# Patient Record
Sex: Female | Born: 1980 | Race: Black or African American | Hispanic: No | Marital: Single | Smoking: Never smoker
Health system: Southern US, Community
[De-identification: ages and names within clinical notes are randomized; demographics above are authoritative.]

## PROBLEM LIST (undated history)

## (undated) DIAGNOSIS — B354 Tinea corporis: Secondary | ICD-10-CM

## (undated) DIAGNOSIS — A749 Chlamydial infection, unspecified: Secondary | ICD-10-CM

## (undated) DIAGNOSIS — B9689 Other specified bacterial agents as the cause of diseases classified elsewhere: Secondary | ICD-10-CM

## (undated) DIAGNOSIS — N760 Acute vaginitis: Secondary | ICD-10-CM

## (undated) DIAGNOSIS — K0889 Other specified disorders of teeth and supporting structures: Secondary | ICD-10-CM

## (undated) HISTORY — PX: TUBAL LIGATION: SHX77

---

## 1999-09-08 ENCOUNTER — Emergency Department (HOSPITAL_COMMUNITY): Admission: EM | Admit: 1999-09-08 | Discharge: 1999-09-08 | Payer: Self-pay | Admitting: Emergency Medicine

## 2000-03-17 ENCOUNTER — Inpatient Hospital Stay (HOSPITAL_COMMUNITY): Admission: AD | Admit: 2000-03-17 | Discharge: 2000-03-17 | Payer: Self-pay | Admitting: Obstetrics

## 2000-05-16 ENCOUNTER — Inpatient Hospital Stay (HOSPITAL_COMMUNITY): Admission: AD | Admit: 2000-05-16 | Discharge: 2000-05-19 | Payer: Self-pay | Admitting: Obstetrics

## 2000-11-09 ENCOUNTER — Inpatient Hospital Stay (HOSPITAL_COMMUNITY): Admission: AD | Admit: 2000-11-09 | Discharge: 2000-11-09 | Payer: Self-pay | Admitting: Obstetrics & Gynecology

## 2001-04-09 ENCOUNTER — Inpatient Hospital Stay (HOSPITAL_COMMUNITY): Admission: AD | Admit: 2001-04-09 | Discharge: 2001-04-09 | Payer: Self-pay | Admitting: Obstetrics & Gynecology

## 2002-01-16 ENCOUNTER — Inpatient Hospital Stay (HOSPITAL_COMMUNITY): Admission: AD | Admit: 2002-01-16 | Discharge: 2002-01-16 | Payer: Self-pay | Admitting: Obstetrics

## 2002-01-17 ENCOUNTER — Encounter: Payer: Self-pay | Admitting: *Deleted

## 2002-01-17 ENCOUNTER — Ambulatory Visit (HOSPITAL_COMMUNITY): Admission: RE | Admit: 2002-01-17 | Discharge: 2002-01-17 | Payer: Self-pay | Admitting: *Deleted

## 2002-01-22 ENCOUNTER — Encounter (HOSPITAL_COMMUNITY): Admission: RE | Admit: 2002-01-22 | Discharge: 2002-02-21 | Payer: Self-pay | Admitting: *Deleted

## 2002-02-09 ENCOUNTER — Inpatient Hospital Stay (HOSPITAL_COMMUNITY): Admission: AD | Admit: 2002-02-09 | Discharge: 2002-02-09 | Payer: Self-pay | Admitting: *Deleted

## 2002-02-18 ENCOUNTER — Observation Stay (HOSPITAL_COMMUNITY): Admission: AD | Admit: 2002-02-18 | Discharge: 2002-02-18 | Payer: Self-pay | Admitting: *Deleted

## 2002-02-27 ENCOUNTER — Inpatient Hospital Stay (HOSPITAL_COMMUNITY): Admission: AD | Admit: 2002-02-27 | Discharge: 2002-02-27 | Payer: Self-pay | Admitting: Obstetrics and Gynecology

## 2002-03-01 ENCOUNTER — Encounter (HOSPITAL_COMMUNITY): Admission: RE | Admit: 2002-03-01 | Discharge: 2002-03-13 | Payer: Self-pay | Admitting: *Deleted

## 2002-03-13 ENCOUNTER — Inpatient Hospital Stay (HOSPITAL_COMMUNITY): Admission: AD | Admit: 2002-03-13 | Discharge: 2002-03-13 | Payer: Self-pay | Admitting: Obstetrics and Gynecology

## 2002-03-14 ENCOUNTER — Inpatient Hospital Stay (HOSPITAL_COMMUNITY): Admission: AD | Admit: 2002-03-14 | Discharge: 2002-03-16 | Payer: Self-pay | Admitting: *Deleted

## 2002-10-28 ENCOUNTER — Ambulatory Visit (HOSPITAL_COMMUNITY): Admission: RE | Admit: 2002-10-28 | Discharge: 2002-10-28 | Payer: Self-pay | Admitting: *Deleted

## 2003-01-21 ENCOUNTER — Inpatient Hospital Stay (HOSPITAL_COMMUNITY): Admission: AD | Admit: 2003-01-21 | Discharge: 2003-01-21 | Payer: Self-pay | Admitting: *Deleted

## 2003-02-11 ENCOUNTER — Inpatient Hospital Stay (HOSPITAL_COMMUNITY): Admission: AD | Admit: 2003-02-11 | Discharge: 2003-02-11 | Payer: Self-pay | Admitting: *Deleted

## 2003-02-14 ENCOUNTER — Inpatient Hospital Stay (HOSPITAL_COMMUNITY): Admission: AD | Admit: 2003-02-14 | Discharge: 2003-02-14 | Payer: Self-pay | Admitting: Family Medicine

## 2003-02-16 ENCOUNTER — Inpatient Hospital Stay (HOSPITAL_COMMUNITY): Admission: AD | Admit: 2003-02-16 | Discharge: 2003-02-19 | Payer: Self-pay | Admitting: Obstetrics and Gynecology

## 2003-08-04 ENCOUNTER — Emergency Department (HOSPITAL_COMMUNITY): Admission: EM | Admit: 2003-08-04 | Discharge: 2003-08-04 | Payer: Self-pay | Admitting: Emergency Medicine

## 2004-04-27 ENCOUNTER — Other Ambulatory Visit: Admission: RE | Admit: 2004-04-27 | Discharge: 2004-04-27 | Payer: Self-pay | Admitting: Obstetrics and Gynecology

## 2004-06-16 ENCOUNTER — Inpatient Hospital Stay (HOSPITAL_COMMUNITY): Admission: AD | Admit: 2004-06-16 | Discharge: 2004-06-19 | Payer: Self-pay | Admitting: Obstetrics and Gynecology

## 2005-02-17 ENCOUNTER — Inpatient Hospital Stay (HOSPITAL_COMMUNITY): Admission: AD | Admit: 2005-02-17 | Discharge: 2005-02-17 | Payer: Self-pay | Admitting: Obstetrics & Gynecology

## 2005-07-15 ENCOUNTER — Other Ambulatory Visit: Admission: RE | Admit: 2005-07-15 | Discharge: 2005-07-15 | Payer: Self-pay | Admitting: Obstetrics and Gynecology

## 2005-08-22 ENCOUNTER — Inpatient Hospital Stay (HOSPITAL_COMMUNITY): Admission: AD | Admit: 2005-08-22 | Discharge: 2005-08-24 | Payer: Self-pay | Admitting: Obstetrics and Gynecology

## 2005-08-23 ENCOUNTER — Encounter (INDEPENDENT_AMBULATORY_CARE_PROVIDER_SITE_OTHER): Payer: Self-pay | Admitting: *Deleted

## 2009-01-13 ENCOUNTER — Emergency Department (HOSPITAL_COMMUNITY): Admission: EM | Admit: 2009-01-13 | Discharge: 2009-01-13 | Payer: Self-pay | Admitting: Emergency Medicine

## 2009-05-19 ENCOUNTER — Emergency Department (HOSPITAL_COMMUNITY): Admission: EM | Admit: 2009-05-19 | Discharge: 2009-05-19 | Payer: Self-pay | Admitting: Emergency Medicine

## 2010-02-09 ENCOUNTER — Emergency Department (HOSPITAL_COMMUNITY): Admission: EM | Admit: 2010-02-09 | Discharge: 2010-02-09 | Payer: Self-pay | Admitting: Family Medicine

## 2010-08-20 ENCOUNTER — Emergency Department (HOSPITAL_COMMUNITY)
Admission: EM | Admit: 2010-08-20 | Discharge: 2010-08-20 | Payer: Self-pay | Source: Home / Self Care | Admitting: Family Medicine

## 2010-11-01 LAB — POCT URINALYSIS DIP (DEVICE)
Glucose, UA: NEGATIVE mg/dL
Hgb urine dipstick: NEGATIVE
Nitrite: NEGATIVE
Protein, ur: 30 mg/dL — AB
Specific Gravity, Urine: 1.025 (ref 1.005–1.030)
Urobilinogen, UA: 1 mg/dL (ref 0.0–1.0)
pH: 6 (ref 5.0–8.0)

## 2010-11-01 LAB — GC/CHLAMYDIA PROBE AMP, GENITAL: Chlamydia, DNA Probe: POSITIVE — AB

## 2010-11-01 LAB — WET PREP, GENITAL

## 2010-11-01 LAB — POCT PREGNANCY, URINE: Preg Test, Ur: NEGATIVE

## 2010-11-18 LAB — DIFFERENTIAL
Basophils Absolute: 0 10*3/uL (ref 0.0–0.1)
Basophils Relative: 0 % (ref 0–1)
Lymphocytes Relative: 28 % (ref 12–46)
Monocytes Absolute: 0.7 10*3/uL (ref 0.1–1.0)
Neutro Abs: 3.9 10*3/uL (ref 1.7–7.7)
Neutrophils Relative %: 60 % (ref 43–77)

## 2010-11-18 LAB — URINALYSIS, ROUTINE W REFLEX MICROSCOPIC
Leukocytes, UA: NEGATIVE
Nitrite: NEGATIVE
Protein, ur: 100 mg/dL — AB
Urobilinogen, UA: 1 mg/dL (ref 0.0–1.0)

## 2010-11-18 LAB — URINE MICROSCOPIC-ADD ON

## 2010-11-18 LAB — COMPREHENSIVE METABOLIC PANEL
Albumin: 4.2 g/dL (ref 3.5–5.2)
Alkaline Phosphatase: 45 U/L (ref 39–117)
BUN: 15 mg/dL (ref 6–23)
Chloride: 106 mEq/L (ref 96–112)
Creatinine, Ser: 1.27 mg/dL — ABNORMAL HIGH (ref 0.4–1.2)
Glucose, Bld: 87 mg/dL (ref 70–99)
Potassium: 3 mEq/L — ABNORMAL LOW (ref 3.5–5.1)
Total Bilirubin: 0.8 mg/dL (ref 0.3–1.2)
Total Protein: 7.7 g/dL (ref 6.0–8.3)

## 2010-11-18 LAB — CBC
HCT: 31.5 % — ABNORMAL LOW (ref 36.0–46.0)
Hemoglobin: 10.4 g/dL — ABNORMAL LOW (ref 12.0–15.0)
MCV: 86.7 fL (ref 78.0–100.0)
Platelets: 250 10*3/uL (ref 150–400)
RDW: 13.6 % (ref 11.5–15.5)

## 2010-11-18 LAB — PREGNANCY, URINE: Preg Test, Ur: NEGATIVE

## 2010-11-18 LAB — LIPASE, BLOOD: Lipase: 38 U/L (ref 11–59)

## 2010-12-31 NOTE — Discharge Summary (Signed)
[name], [name]            ACCOUNT NO.:  [id number]   MEDICAL RECORD NO.:  [id number]          PATIENT TYPE:  INP   LOCATION:  9111                          F[facility]ILITY:  [facility]   PHYSICIAN:  Crist Fat. Rivard, M.D. DATE OF BIRTH:  August 13, 1981   DATE OF ADMISSION:  08/22/2005  DATE OF DISCHARGE:  08/24/2005                                 DISCHARGE SUMMARY   ADMITTING DIAGNOSES:  1.  Intrauterine pregnancy at 40-3/7 weeks.  2.  Early labor.  3.  Positive group B Strep.  4.  Desires tubal sterilization.   DISCHARGE DIAGNOSES:  1.  Intrauterine pregnancy at term.  2.  Desired tubal sterilization.  3.  Postpartum anemia without hemodynamic compromise.   PROCEDURES:  1.  Spontaneous vaginal birth.  2.  Postpartum tubal ligation.  3.  Epidural anesthesia.  4.  General anesthesia.   HOSPITAL COURSE:  [name] is a 30 year old gravida 5, para 4-0-0-4 at  40-3/7 weeks who was admitted on August 22, 2005 with early labor.  Her  pregnancy had been remarkable for grand multiparity, history of postpartum  depression with her first pregnancy requiring no medications, closely spaced  pregnancy, history of abnormal Pap, anemia, desires tubal sterilization,  positive group B Strep.  On admission cervix was 4, 75%, vertex at a -2  station.  She was contracting every five to eight minutes.  She was admitted  to birthing suite, given antibiotic prophylaxis for group B Strep.  She was  started on Pitocin for labor augmentation.  Artificial ruptured membranes  was accomplished with clear fluid noted.  She progressed well to delivery at  5:47 p.m. on the afternoon of August 22, 2005.  She had an intact perineum.  She had epidural anesthesia.  She desired tubal sterilization.  Consents had  been signed during her pregnancy.  Infant was taken to the full-term  nursery.  By postpartum day one patient was doing well.  Patient was bottle  feeding.  She was out of bed without difficulty.  No  dizziness or syncope.  Her hemoglobin was 7.7 down from 10.  White blood cell count 8.6.  Platelets  were 204.  She was taken to the operating room where a postpartum tubal  sterilization was performed.  Initially epidural anesthesia was administered  through the pre-existing catheter.  However, this was inadequate and the  patient was put to sleep with general endotracheal anesthesia.  She  tolerated the procedure well and was taken to recovery in good condition.  By postpartum day two, postoperative day one patient was doing well.  She  was up ad lib.  Her orthostatics were stable.  She was deemed to have  received the full benefit of her hospital stay and was discharged home.  Her  incision was clean, dry, and intact and her vital signs were stable.   CONDITION ON DISCHARGE:  Stable.   DISCHARGE MEDICATIONS:  1.  Motrin 600 mg p.o. q.6h. p.r.n. pain.  2.  Tylox one to two p.o. q.3-4h. p.r.n. pain.  3.  Hemocyte one p.o. daily.   DISCHARGE INSTRUCTIONS:  Per [place]  OB handout.   DISCHARGE FOLLOW-UP:  Six weeks at [facility].      Renaldo Reel Emilee Hero, C.N.M.      Crist Fat Rivard, M.D.  Electronically Signed    VLL/MEDQ  D:  08/24/2005  T:  08/24/2005  Job:  [id number]

## 2010-12-31 NOTE — H&P (Signed)
NAMESHAKARA, [name]            ACCOUNT NO.:  [id number]   MEDICAL RECORD NO.:  [id number]          PATIENT TYPE:  INP   LOCATION:  9175                          [facility]  [facility]   PHYSICIAN:  Hal Morales, M.D.DATE OF BIRTH:  09-30-1980   DATE OF ADMISSION:  08/22/2005  DATE OF DISCHARGE:                                HISTORY & PHYSICAL   [name] is a 30 year old gravida 5, para 4-0-0-4 at 40-3/7 weeks who  presents today with uterine contractions every five to eight minutes for  several hours.  Her pregnancy has been remarkable for grand multiparity,  history of postpartum depression with her first pregnancy (no medications),  closely spaced pregnancy, history of abnormal Pap, anemia, desires tubal  sterilization, positive group B Strep.   PRENATAL LABORATORIES:  Blood type is B+.  Rh antibody negative.  VDRL  nonreactive.  Rubella titer positive.  Hepatitis B surface antigen negative.  HIV and cystic fibrosis testing were declined.  Sickle cell test negative.  GC and Chlamydia cultures were negative in the second trimester.  Pap was  normal.  Group B Strep culture was positive at 36 weeks.  GC and Chlamydia  cultures were negative at 36 weeks.  Hemoglobin upon entry into practice was  11.  It was 9.4 at 32 weeks.   HISTORY OF PRESENT PREGNANCY:  Patient entered care at approximately 19-6/7  weeks.  She declined quadruple screen.  She had an ultrasound soon after  that showing normal growth and development and a female infant.  Patient had  several missed appointments, was not seen again from 20 weeks to  approximately 32 weeks.  Glucola was given which was normal.  She had  another ultrasound at 32 weeks secondary to slight size greater than dates.  Growth was at the 50-57th percentile.  Everything was normal.  She had  another missed appointment.  35 weeks she had GBS and GC/Chlamydia cultures  done.  She had another ultrasound following that with normal findings.  Positive beta Strep was noted.   PAST OBSTETRICAL HISTORY:  In 2001 she had a vaginal birth of a female infant,  weight 7 pounds 4 ounces at 40 weeks.  She was in labor nine hours.  She had  epidural anesthesia.  That child was delivered by Dr. Gaynell Face.  2004 she  had a vaginal birth of a female infant, weight 8 pounds at 40 weeks.  She was  in labor four hours with epidural anesthesia.  She was cared for by teaching  service physicians during that pregnancy and she had positive beta Strep.  She did have postpartum depression following her first pregnancy, but was  not on any medication.  In 2005 she had a vaginal birth of a female infant  weight 6 pounds 13 ounces at 40 weeks.  She was in labor six hours.  She had  epidural anesthesia with that pregnancy and was delivered by the teaching  service.  In November of 2005 she had a vaginal birth of a female infant,  weight 5 pounds 10 ounces at 37 weeks.  She was in labor five  hours.  She  had epidural anesthesia.  She was delivered with [facility] during  that pregnancy.   PAST MEDICAL HISTORY:  She had a colposcopy with a biopsy in 1998.  She was  treated for gonorrhea in 1999.  She has no known medication allergies.   FAMILY HISTORY:  Maternal grandmother had hypertension.  Maternal  grandmother and paternal grandmother had diabetes.   GENETIC HISTORY:  Unremarkable.   SOCIAL HISTORY:  Patient is single.  Father of the baby is not currently  involved and not currently present with her.  She does have a friend with  her today.  She is African-American.  She denies any alcohol, drug, or  tobacco use during this pregnancy and has been followed by the certified  nurse midwife service at [facility].   PHYSICAL EXAMINATION:  VITAL SIGNS:  Stable.  Patient is afebrile.  HEENT:  Within normal limits.  LUNGS:  Bilateral breath sounds are clear.  HEART:  Regular rate and rhythm without murmur.  BREASTS:  Soft and nontender.   ABDOMEN:  Fundal height is approximately 38 cm.  Estimated fetal weight is  7.5 pounds.  Uterine contractions are every four to eight minutes, mild to  moderate quality.  PELVIC:  Cervical examination is 4 cm, 75%, vertex at a -2 station.  The  vertex is applied, but is quite high.  Fetal heart rate is reactive with no  decelerations.  EXTREMITIES:  Deep tendon reflexes are 2+ without clonus.  There is a trace  edema noted.   IMPRESSION:  1.  Intrauterine pregnancy at 40-3/7 weeks.  2.  Early labor.  3.  Closely spaced pregnancy.  4.  Positive group B Strep.  5.  Desires tubal sterilization.   PLAN:  1.  Admit to birthing suite for consult with Dr. Dierdre Forth as      attending physician.  2.  Routine certified nurse midwife orders.  3.  Patient currently is holding in maternity admissions.  When she is able      to be transferred to birthing suite will reevaluate her cervix for      artificial rupture of membranes or possible augmentation.  4.  Group B Strep prophylaxis with ampicillin secondary to the patient's      history of rapid labor.  5.  Will investigate tubal ligation request and see if papers have been      signed yet.      Renaldo Reel Emilee Hero, C.N.M.      Hal Morales, M.D.  Electronically Signed    VLL/MEDQ  D:  08/22/2005  T:  08/22/2005  Job:  [id number]

## 2010-12-31 NOTE — Op Note (Signed)
[name], [name]            ACCOUNT NO.:  [id number]   MEDICAL RECORD NO.:  [id number]          PATIENT TYPE:  INP   LOCATION:  9111                          [facility]   PHYSICIAN:  Naima A. Dillard, M.D. DATE OF BIRTH:  Jan 02, 1981   DATE OF PROCEDURE:  08/23/2005  DATE OF DISCHARGE:                                 OPERATIVE REPORT   PREOPERATIVE DIAGNOSIS:  Multiparity, desires postpartum tubal ligation.   POSTOPERATIVE DIAGNOSIS:  Multiparity, desires postpartum tubal ligation.   PROCEDURE:  Postpartum tubal ligation.   SURGEON:  Naima A. Normand Sloop, M.D.   ANESTHESIA:  General endotracheal tube due to the epidural was not found to  be adequate.   ESTIMATED BLOOD LOSS:  Minimal.   COMPLICATIONS:  None.   FINDINGS:  Normal fallopian tubes bilaterally.   PROCEDURE IN DETAIL:  Before the procedure, the patient was noted to be  anemic with a hemoglobin of 7.7.  Before delivery, her hemoglobin was 10.  She was advised that we could wait six weeks for hemoglobin to arise and do  a postpartum tubal or presume the postpartum tubal now, but if there was any  bleeding she would probably need some blood transfusion, and the risks and  benefits of blood transfusion was reviewed with the patient.  The patient  has still decided to proceed with the procedure.  She was taken to the  operating room and her epidural anesthesia was not found to be adequate, so  she was given general anesthesia with an endotracheal tube.  She was prepped  and draped in a normal sterile fashion.  Marcaine 0.5% with epinephrine 5 mL  was used for local anesthesia.  A 10 mm infraumbilical incision was made  with a scalpel and carried down to the fascia.  The fascia was incised in  the midline and carried down bilaterally.  The fascia was then excised and  then extended bilaterally.  The peritoneum was identified, tented up and  entered sharply without difficulty.  The patient's right fallopian tube  was  identified, followed out to its fimbriated end, suture ligated with 2-0  plain and about a centimeter of the midisthmic portion of the tube was  excised, hemostasis was assured, and the tube was returned back to the  abdomen.  The patient's left fallopian tube was identified, followed out to  the fimbriated end.  About a centimeter of the tube was ligated with 2-0  plain and excised.  Hemostasis was assured.  The patient also had a small  tubal cyst, which was excised, and the  mesosalpinx was repaired with 2-0 plain with a free tie.  Hemostasis was  assured.  The tube was returned to the abdomen.  The fascia was closed with  0 Vicryl.  The skin was closed with 3-0 Monocryl and Dermabond.  Sponge, lap  and needle counts were correct x2.  The patient returned to the recovery  room in stable condition.      Naima A. Normand Sloop, M.D.  Electronically Signed     NAD/MEDQ  D:  08/23/2005  T:  08/23/2005  Job:  [id number]

## 2010-12-31 NOTE — H&P (Signed)
[name], [name]            ACCOUNT NO.:  [id number]   MEDICAL RECORD NO.:  [id number]          PATIENT TYPE:  INP   LOCATION:  9167                          [facility]   PHYSICIAN:  Cam Hai, C.N.M.  DATE OF BIRTH:  Jul 08, 1981   DATE OF ADMISSION:  06/16/2004  DATE OF DISCHARGE:                                HISTORY & PHYSICAL   HISTORY OF PRESENT ILLNESS:  The patient is a 30 year old single black  female, gravida 4, para 3-0-0-3, at 37-4/7 weeks who presents with regular  uterine contractions since 7 p.m. and had irregular more mild contractions  earlier today.  She denies leaking, bleeding, headache, nausea and vomiting,  and visual disturbances.  Her pregnancy has been followed by the [facility] certified nurse midwife service and has been remarkable for:  1)  Closely spaced pregnancies.  2) History of abnormal Pap smear.  3) Anemia.  4) Group B Strep negative.  Her cervix was 1 to 2 cm at 3 p.m. today.   PRENATAL LABORATORY DATA:  Collected on March 11, 2004; hemoglobin 9.9,  hematocrit 29.4, platelets 266,000. Blood type B positive, antibody  negative, sickle cell trait negative, RPR nonreactive, rubella immune,  hepatitis B surface antigen negative, HIV nonreactive, Gonorrhea negative,  Chlamydia negative.  Her one-hour Glucola from April 27, 2004, was 65  and her RPR at that time was nonreactive.  Pap smear from April 27, 2004, was within normal limits.  Culture of the vaginal tract for Group B  Strep, gonorrhea and Chlamydia on June 08, 2004, were all negative.   HISTORY OF PRESENT PREGNANCY:  The patient presented for care at [facility] on January 19, 2004, at 16-2/[redacted] weeks gestation.  Pregnancy  ultrasonography from January 28, 2004, shows growth consistent with previous  dating confirming [facility] of July 03, 2004.  She was treated for Candida  twice during the pregnancy.  She had an ultrasound for growth due to  measuring size  greater than dates.  This was done on May 25, 2004, at [redacted]  weeks gestation and showed estimated fetal weight of the 50th to 75th  percentile with normal fluid and with a breech presentation at that time.  Follow-up ultrasound at 36-1/[redacted] weeks gestation showed a vertex presentation.  The patient expressed the desire for bilateral tubal ligation during the  pregnancy and she did sign her papers.   PAST OBSTETRICAL HISTORY:  She is a gravida 4, para 3-0-0-3.  In 2001, she  had a vaginal delivery of a female infant weighing 7 pounds 4 ounces at [redacted]  weeks gestation after nine hours in labor.  She had an epidural for  anesthesia.  Infant's name was [place].  In 2003, she had a vaginal  delivery of a female infant weighing 6 pounds 13 ounces at [redacted] weeks  gestation after six hours of labor.  She had an epidural for anesthesia and  the infant's name was [place].  In 2004, she had a vaginal delivery of a female  infant weighing 8 pounds.  This was at [redacted] weeks gestation after four hours  in labor with epidural for anesthesia and the infant's name is [place].  She  reports being anemic with her pregnancies.  Also reports postpartum  depression following her first pregnancy lasting one month with no  treatment.   ALLERGIES:  No known drug allergies.   PAST MEDICAL HISTORY:  She experienced menarche at the age of 57 with  regular cycles since, lasting four to five days.  She has had a colposcopy  with biopsy in 1998.  She was treated for gonorrhea in 1999.  She reports  having Group B Strep with her third pregnancy.  She reports having had the  usual childhood diseases.   PAST SURGICAL HISTORY:  Negative.   FAMILY HISTORY:  Remarkable for maternal grandmother with hypertension and  both grandmothers with diabetes.  Genetic history is negative.   SOCIAL HISTORY:  The father of the baby's name is Windy Fast.  He is involved  with the pregnancy.  The patient is GED educated and employed at [employer].  She  denies any alcohol, tobacco, or illicit drug use with the pregnancy.   PHYSICAL EXAMINATION:  VITAL SIGNS:  Stable, she is afebrile.  HEENT:  Grossly within normal limits.  CHEST:  Clear to auscultation.  HEART:  Regular rate and rhythm.  ABDOMEN:  Gravid in contour.  The fundal height extends approximately 38 cm  above the pubic symphysis.  Fetal heart rate is reactive and reassuring.  Uterine contractions every four to five minutes.  PELVIC:  Cervix is 3 cm, 100% effaced, vertex -2 with bulging bag of water  and prior to ambulating was 2 cm and 60%.  EXTREMITIES:  Within normal limits.   ASSESSMENT:  1.  Intrauterine pregnancy at term.  2.  Early labor.   PLAN:  Admit to birthing suite for consult with Dr. Stefano Gaul.  Routine  C.N.M. orders.  The patient plans epidural for labor.  Anticipate normal  spontaneous vaginal delivery.      KS/MEDQ  D:  06/16/2004  T:  06/16/2004  Job:  [id number]

## 2011-09-16 ENCOUNTER — Encounter (HOSPITAL_COMMUNITY): Payer: Self-pay

## 2011-09-16 ENCOUNTER — Emergency Department (INDEPENDENT_AMBULATORY_CARE_PROVIDER_SITE_OTHER)
Admission: EM | Admit: 2011-09-16 | Discharge: 2011-09-16 | Disposition: A | Discharge status: Home / Self Care | Payer: Self-pay | Source: Home / Self Care | Attending: Emergency Medicine | Admitting: Emergency Medicine

## 2011-09-16 DIAGNOSIS — N760 Acute vaginitis: Secondary | ICD-10-CM

## 2011-09-16 DIAGNOSIS — B354 Tinea corporis: Secondary | ICD-10-CM

## 2011-09-16 LAB — WET PREP, GENITAL
Trich, Wet Prep: NONE SEEN
Yeast Wet Prep HPF POC: NONE SEEN

## 2011-09-16 LAB — POCT URINALYSIS DIP (DEVICE)
Hgb urine dipstick: NEGATIVE
Protein, ur: NEGATIVE mg/dL
Specific Gravity, Urine: 1.025 (ref 1.005–1.030)
Urobilinogen, UA: 0.2 mg/dL (ref 0.0–1.0)
pH: 5.5 (ref 5.0–8.0)

## 2011-09-16 LAB — POCT PREGNANCY, URINE: Preg Test, Ur: NEGATIVE

## 2011-09-16 MED ORDER — KETOCONAZOLE 2 % EX CREA: TOPICAL_CREAM | Freq: Every day | CUTANEOUS | Status: DC

## 2011-09-16 MED ORDER — METRONIDAZOLE 500 MG PO TABS: 500.0000 mg | ORAL_TABLET | Freq: Two times a day (BID) | ORAL | Status: AC

## 2011-09-16 MED ORDER — FLUCONAZOLE 150 MG PO TABS
ORAL_TABLET | ORAL | Status: DC
Start: 2011-09-16 — End: 2011-11-16

## 2011-09-16 NOTE — ED Provider Notes (Signed)
History     CSN: [id number]  Arrival date & time 09/16/11  4098   First MD Initiated Contact with Patient 09/16/11 [phone]      Chief Complaint  Patient presents with  . Vaginal Discharge  . Rash    (Consider location/radiation/quality/duration/timing/severity/associated sxs/prior treatment) HPI Comments: Last Saturday had some discomfort and then the discharge started, the pain its gone, but the discharge its worse, its white, also with  itchy rash on my R breast   Patient is a 31 y.o. female presenting with vaginal discharge and rash. The history is provided by the patient.  Vaginal Discharge This is a new problem. The current episode started more than 1 week ago. The problem occurs constantly. The problem has not changed since onset.Associated symptoms include abdominal pain. The symptoms are aggravated by nothing. The symptoms are relieved by nothing. She has tried nothing for the symptoms.  Rash     History reviewed. No pertinent past medical history.  History reviewed. No pertinent past surgical history.  No family history on file.  History  Substance Use Topics  . Smoking status: Never Smoker   . Smokeless tobacco: Not on file  . Alcohol Use: Yes    OB History    Grav Para Term Preterm Abortions TAB SAB Ect Mult Living                  Review of Systems  Constitutional: Negative for fever, appetite change and fatigue.  Eyes: Negative for photophobia, itching and visual disturbance.  Gastrointestinal: Positive for abdominal pain.  Genitourinary: Positive for vaginal discharge. Negative for urgency and genital sores.  Musculoskeletal: Negative for arthralgias.  Skin: Positive for rash.    Allergies  Review of patient's allergies indicates no known allergies.  Home Medications   Current Outpatient Rx  Name Route Sig Dispense Refill  . FLUCONAZOLE 150 MG PO TABS  1 tab po today repeat in 7 days 2 tablet 0  . KETOCONAZOLE 2 % EX CREA Topical Apply topically  daily. Apply twice a day x 3 weeks 60 g 0  . METRONIDAZOLE 500 MG PO TABS Oral Take 1 tablet (500 mg total) by mouth 2 (two) times daily. 14 tablet 0    BP 121/87  Pulse 76  Temp(Src) 98.5 F (36.9 C) (Oral)  Resp 16  SpO2 100%  LMP 08/29/2011  Physical Exam  Nursing note and vitals reviewed. Constitutional: She appears well-developed and well-nourished. No distress.  Abdominal: She exhibits no mass. There is no tenderness. There is no rebound and no guarding.  Genitourinary: Vaginal discharge found.  Neurological: She is alert.  Skin: Rash noted.       ED Course  Procedures (including critical care time)  Labs Reviewed  POCT URINALYSIS DIP (DEVICE) - Abnormal; Notable for the following:    Ketones, ur TRACE (*)    Leukocytes, UA TRACE (*) Biochemical Testing Only. Please order routine urinalysis from main lab if confirmatory testing is needed.   All other components within normal limits  POCT PREGNANCY, URINE  GC/CHLAMYDIA PROBE AMP, GENITAL  WET PREP, GENITAL   No results found.   1. Vaginitis   2. Tinea corporis       MDM  Vaginal discharge, and co-existent R sided rash with tinea corporis characteristics        Jimmie Molly, MD 09/16/11 [phone]

## 2011-09-16 NOTE — ED Notes (Signed)
Wet prep: Many clue cells, many WBC's.  Pt. adequately treated with Flagyl.  GC/Chlamydia pending. Vassie Moselle 09/16/2011

## 2011-09-16 NOTE — ED Notes (Signed)
C/o vaginal discharge with odor since Saturday.  States sx started with sharp abdominal pain which resolved.  Pt states at the same time she noticed a rash to rt breast on Saturday-rash was itchy at first but now has scabbed over and itching resolved.  Denies urinary sx.

## 2011-09-19 NOTE — ED Notes (Signed)
GC/Chlamydia neg. Vassie Moselle 09/19/2011

## 2011-11-16 ENCOUNTER — Emergency Department (INDEPENDENT_AMBULATORY_CARE_PROVIDER_SITE_OTHER)
Admission: EM | Admit: 2011-11-16 | Discharge: 2011-11-16 | Disposition: A | Discharge status: Home / Self Care | Payer: Self-pay | Source: Home / Self Care | Attending: Emergency Medicine | Admitting: Emergency Medicine

## 2011-11-16 ENCOUNTER — Encounter (HOSPITAL_COMMUNITY): Payer: Self-pay | Admitting: *Deleted

## 2011-11-16 DIAGNOSIS — R109 Unspecified abdominal pain: Secondary | ICD-10-CM

## 2011-11-16 HISTORY — DX: Chlamydial infection, unspecified: A74.9

## 2011-11-16 HISTORY — DX: Other specified bacterial agents as the cause of diseases classified elsewhere: N76.0

## 2011-11-16 HISTORY — DX: Tinea corporis: B35.4

## 2011-11-16 HISTORY — DX: Other specified bacterial agents as the cause of diseases classified elsewhere: B96.89

## 2011-11-16 LAB — POCT URINALYSIS DIP (DEVICE)
Nitrite: NEGATIVE
Protein, ur: 30 mg/dL — AB
Specific Gravity, Urine: >=1.03 (ref 1.005–1.030)
Urobilinogen, UA: 0.2 mg/dL (ref 0.0–1.0)

## 2011-11-16 LAB — POCT H PYLORI SCREEN: H. PYLORI SCREEN, POC: NEGATIVE

## 2011-11-16 LAB — POCT PREGNANCY, URINE: Preg Test, Ur: NEGATIVE

## 2011-11-16 MED ORDER — PANTOPRAZOLE SODIUM 40 MG PO TBEC: 40.0000 mg | DELAYED_RELEASE_TABLET | Freq: Every day | ORAL | Status: DC

## 2011-11-16 MED ORDER — FAMOTIDINE 20 MG PO TABS: 20.0000 mg | ORAL_TABLET | Freq: Every day | ORAL | Status: DC

## 2011-11-16 NOTE — Discharge Instructions (Signed)
Take the medication as written. Followup with a gastroenterologist if her symptoms do not get better within 2 weeks. Return if you've a fever above 100.4, if you're unable to keep any liquids down, if you get worse, or for any other concerns.

## 2011-11-16 NOTE — ED Notes (Signed)
Pt states she has been having intermittent, cramping abd pain since Saturday.  States when the pain occurs it causes her to break out in a sweat and feels like she has to have a BM.  Occasionally will have diarrhea, other times can't do anything.  States that sometimes it's with eating, sometimes just smelling foods.  Other times she can eat and has no pain.  This am she vomited one time and had diarrhea.  Has not had anything to eat today.  States she feel hungry.  Menstrual period also started Saturday, doesn't usually have any pain with her periods.  Denies pain at present time.

## 2011-11-16 NOTE — ED Provider Notes (Signed)
History     CSN: [id number]  Arrival date & time 11/16/11  1011   First MD Initiated Contact with Patient 11/16/11 1109      Chief Complaint  Patient presents with  . Abdominal Pain  . Diarrhea  . Emesis    (Consider location/radiation/quality/duration/timing/severity/associated sxs/prior treatment) HPI Comments: Patient reports intermittent episodes of diffuse, poorly characterized abdominal pain that occurs primarily after eating. States symptoms start about an hour or so after eating. Contacts last approximate 10 minutes and then resolve. Patient sometimes feels the urge to defecate, and has had 2 loose, nonbloody stools. This morning reports loose stool, followed by an episode of emesis. Pain is not associated with fasting, movement, stooling, urination. No other aggravating factors. Patient states she feels better when she lays down, but no other alleviating factors. Patient hasn't tried anything for this. No recent change in diet, recent travel, recent antibiotics. No abdominal distention, back pain, urinary complaints, vaginal discharge. Patient is currently on menses. STDs not a concern today. Patient has no history of PUD, gallstones, IBS.  ROS as noted in HPI. All other ROS negative.   Patient is a 31 y.o. female presenting with abdominal pain. The history is provided by the patient. No language interpreter was used.  Abdominal Pain The primary symptoms of the illness include abdominal pain. The current episode started more than 2 days ago. The onset of the illness was sudden. The problem has not changed since onset. The patient states that she believes she is currently not pregnant. The patient has had a change in bowel habit. Symptoms associated with the illness do not include chills, anorexia, diaphoresis, heartburn, constipation, urgency, hematuria, frequency or back pain.    History reviewed. No pertinent past medical history.  Past Surgical History  Procedure Date  .  Tubal ligation     No family history on file.  History  Substance Use Topics  . Smoking status: Never Smoker   . Smokeless tobacco: Not on file  . Alcohol Use: Yes     socially    OB History    Grav Para Term Preterm Abortions TAB SAB Ect Mult Living                  Review of Systems  Constitutional: Negative for chills and diaphoresis.  Gastrointestinal: Positive for abdominal pain. Negative for heartburn, constipation and anorexia.  Genitourinary: Negative for urgency, frequency and hematuria.  Musculoskeletal: Negative for back pain.    Allergies  Review of patient's allergies indicates no known allergies.  Home Medications   Current Outpatient Rx  Name Route Sig Dispense Refill  . FLUCONAZOLE 150 MG PO TABS  1 tab po today repeat in 7 days 2 tablet 0  . KETOCONAZOLE 2 % EX CREA Topical Apply topically daily. Apply twice a day x 3 weeks 60 g 0    BP 128/67  Pulse 67  Temp(Src) 99 F (37.2 C) (Oral)  Resp 17  SpO2 100%  LMP 11/12/2011  Physical Exam  Nursing note and vitals reviewed. Constitutional: She is oriented to person, place, and time. She appears well-developed and well-nourished.  HENT:  Head: Normocephalic and atraumatic.  Eyes: Conjunctivae and EOM are normal.  Neck: Normal range of motion.  Cardiovascular: Normal rate and regular rhythm.   Murmur heard. Pulmonary/Chest: Effort normal and breath sounds normal.  Abdominal: Soft. Normal appearance and bowel sounds are normal. She exhibits no distension. There is no hepatosplenomegaly. There is no tenderness. There is no rigidity,  no rebound, no guarding and no CVA tenderness. No hernia.  Musculoskeletal: Normal range of motion.  Neurological: She is alert and oriented to person, place, and time.  Skin: Skin is warm and dry.  Psychiatric: She has a normal mood and affect. Her behavior is normal. Judgment and thought content normal.    ED Course  Procedures (including critical care  time)  Labs Reviewed - No data to display No results found.   No diagnosis found. Results for orders placed during the hospital encounter of 11/16/11  POCT H PYLORI SCREEN      Component Value Range   H. PYLORI SCREEN, POC NEGATIVE  NEGATIVE   POCT URINALYSIS DIP (DEVICE)      Component Value Range   Glucose, UA NEGATIVE  NEGATIVE (mg/dL)   Bilirubin Urine NEGATIVE  NEGATIVE    Ketones, ur NEGATIVE  NEGATIVE (mg/dL)   Specific Gravity, Urine >=1.030  1.005 - 1.030    Hgb urine dipstick LARGE (*) NEGATIVE    pH 6.0  5.0 - 8.0    Protein, ur 30 (*) NEGATIVE (mg/dL)   Urobilinogen, UA 0.2  0.0 - 1.0 (mg/dL)   Nitrite NEGATIVE  NEGATIVE    Leukocytes, UA NEGATIVE  NEGATIVE   POCT PREGNANCY, URINE      Component Value Range   Preg Test, Ur NEGATIVE  NEGATIVE       MDM  Previous records, labs reviewed. Additional medical history obtained Patient was seen a month ago, thought to have vaginitis and tinea corporis. Had BV. Also history of Chlamydia.   Pt abd exam is benign, no peritoneal signs. No evidence of surgical abd. Doubt SBO, mesenteric ischemia, appendicitis, hepatitis, cholecystitis, pancreatitis, or perforated viscus. No evidence to support or suggest GYN pathology such as ovarian torsion or infection. Will check H. pylori screen, and start H2 blocker if no improvement with this, will have her followup with GI.   Reviewed labs. Udip noted. Patient on menses. Discussed labs and plan with patient, who agrees.  Luiz Blare, MD 11/16/11 [phone]

## 2012-11-01 ENCOUNTER — Encounter (HOSPITAL_COMMUNITY): Payer: Self-pay | Admitting: Emergency Medicine

## 2012-11-01 ENCOUNTER — Emergency Department (HOSPITAL_COMMUNITY)
Admission: EM | Admit: 2012-11-01 | Discharge: 2012-11-02 | Disposition: A | Discharge status: Home / Self Care | Payer: Medicaid Other | Attending: Emergency Medicine | Admitting: Emergency Medicine

## 2012-11-01 DIAGNOSIS — R197 Diarrhea, unspecified: Secondary | ICD-10-CM

## 2012-11-01 DIAGNOSIS — Z9851 Tubal ligation status: Secondary | ICD-10-CM | POA: Insufficient documentation

## 2012-11-01 DIAGNOSIS — R112 Nausea with vomiting, unspecified: Secondary | ICD-10-CM | POA: Insufficient documentation

## 2012-11-01 DIAGNOSIS — Z8619 Personal history of other infectious and parasitic diseases: Secondary | ICD-10-CM | POA: Insufficient documentation

## 2012-11-01 DIAGNOSIS — Z8742 Personal history of other diseases of the female genital tract: Secondary | ICD-10-CM | POA: Insufficient documentation

## 2012-11-01 DIAGNOSIS — R1084 Generalized abdominal pain: Secondary | ICD-10-CM | POA: Insufficient documentation

## 2012-11-01 LAB — BASIC METABOLIC PANEL
CO2: 21 mEq/L (ref 19–32)
Chloride: 102 mEq/L (ref 96–112)
GFR calc Af Amer: 83 mL/min — ABNORMAL LOW (ref >90)
Potassium: 3.4 mEq/L — ABNORMAL LOW (ref 3.5–5.1)
Sodium: 136 mEq/L (ref 135–145)

## 2012-11-01 MED ORDER — SODIUM CHLORIDE 0.9 % IV BOLUS (SEPSIS)
1000.0000 mL | Freq: Once | INTRAVENOUS | Status: AC
  Administered 2012-11-01: 1000 mL via INTRAVENOUS

## 2012-11-01 MED ORDER — SODIUM CHLORIDE 0.9 % IV SOLN: 1000.0000 mL | Freq: Once | INTRAVENOUS | Status: DC

## 2012-11-01 MED ORDER — SODIUM CHLORIDE 0.9 % IV SOLN: 1000.0000 mL | INTRAVENOUS | Status: DC

## 2012-11-01 MED ORDER — ONDANSETRON 4 MG PO TBDP: 4.0000 mg | ORAL_TABLET | Freq: Four times a day (QID) | ORAL | Status: DC | PRN

## 2012-11-01 MED ORDER — ONDANSETRON HCL 4 MG/2ML IJ SOLN
4.0000 mg | Freq: Once | INTRAMUSCULAR | Status: AC
  Administered 2012-11-01: 4 mg via INTRAVENOUS
  Filled 2012-11-01: qty 2

## 2012-11-01 MED ORDER — KETOROLAC TROMETHAMINE 30 MG/ML IJ SOLN
15.0000 mg | Freq: Once | INTRAMUSCULAR | Status: AC
  Administered 2012-11-01: 15 mg via INTRAVENOUS
  Filled 2012-11-01: qty 1

## 2012-11-01 NOTE — ED Notes (Signed)
Patient with body aches chills, nausea, vomiting and diarrhea.  Symptoms started around 1pm today.  No respiratory symptoms.

## 2012-11-01 NOTE — ED Notes (Signed)
Pt presents with c/o nausea, vomiting, and diarrhea that started earlier today   Pt is c/o generalized abd pain   Pt is actively vomiting in triage

## 2012-11-05 NOTE — ED Provider Notes (Signed)
History    32 year old female with nausea, vomiting diarrhea. Symptom onset earlier today. Mild crampy, diffuse abdominal pain. Does localize. No urinary complaints. No fevers or chills. No blood in her stool or emesis. No recent antibiotic use. No significant travel history. No recent backpack or camping. No sick contacts. No significant past medical history. No intervention prior to arrival.  CSN: [id number]  Arrival date & time 11/01/12  2110   First MD Initiated Contact with Patient 11/01/12 2256      Chief Complaint  Patient presents with  . Emesis  . Diarrhea    (Consider location/radiation/quality/duration/timing/severity/associated sxs/prior treatment) HPI  Past Medical History  Diagnosis Date  . BV (bacterial vaginosis)   . Tinea corporis   . Chlamydia     Past Surgical History  Procedure Laterality Date  . Tubal ligation      Family History  Problem Relation Age of Onset  . Stroke Other   . Diabetes Other   . Hypertension Other     History  Substance Use Topics  . Smoking status: Never Smoker   . Smokeless tobacco: Not on file  . Alcohol Use: Yes     Comment: socially    OB History   Grav Para Term Preterm Abortions TAB SAB Ect Mult Living                  Review of Systems  All systems reviewed and negative, other than as noted in HPI.   Allergies  Review of patient's allergies indicates no known allergies.  Home Medications   Current Outpatient Rx  Name  Route  Sig  Dispense  Refill  . ondansetron (ZOFRAN ODT) 4 MG disintegrating tablet   Oral   Take 1 tablet (4 mg total) by mouth every 6 (six) hours as needed for nausea.   15 tablet   0     BP 107/61  Pulse 88  Temp(Src) 98.2 F (36.8 C) (Oral)  Resp 20  SpO2 100%  LMP 10/29/2012  Physical Exam  Nursing note and vitals reviewed. Constitutional: She appears well-developed and well-nourished. No distress.  HENT:  Head: Normocephalic and atraumatic.  Eyes: Conjunctivae are  normal. Right eye exhibits no discharge. Left eye exhibits no discharge.  Neck: Neck supple.  Cardiovascular: Normal rate, regular rhythm and normal heart sounds.  Exam reveals no gallop and no friction rub.   No murmur heard. Pulmonary/Chest: Effort normal and breath sounds normal. No respiratory distress.  Abdominal: Soft. She exhibits no distension. There is no tenderness.  Musculoskeletal: She exhibits no edema and no tenderness.  Neurological: She is alert.  Skin: Skin is warm and dry.  Psychiatric: She has a normal mood and affect. Her behavior is normal. Thought content normal.    ED Course  Procedures (including critical care time)  Labs Reviewed  BASIC METABOLIC PANEL - Abnormal; Notable for the following:    Potassium 3.4 (*)    Glucose, Bld 129 (*)    GFR calc non Af Amer 72 (*)    GFR calc Af Amer 83 (*)    All other components within normal limits   No results found.    1. Nausea vomiting and diarrhea       MDM  31yF with n/v/d for less than 24 hours. Benign abdominal exam. Well appearing. Labs fairly unremarkable. I feel safe for DC at this time. Plan continued symptomatic tx.         Raeford Razor, MD 11/06/12 [phone]

## 2014-07-04 ENCOUNTER — Encounter (HOSPITAL_COMMUNITY): Payer: Self-pay | Admitting: Emergency Medicine

## 2014-07-04 ENCOUNTER — Emergency Department (HOSPITAL_COMMUNITY)
Admission: EM | Admit: 2014-07-04 | Discharge: 2014-07-04 | Disposition: A | Discharge status: Home / Self Care | Payer: Medicaid Other | Attending: Emergency Medicine | Admitting: Emergency Medicine

## 2014-07-04 DIAGNOSIS — Z791 Long term (current) use of non-steroidal anti-inflammatories (NSAID): Secondary | ICD-10-CM | POA: Insufficient documentation

## 2014-07-04 DIAGNOSIS — Z8619 Personal history of other infectious and parasitic diseases: Secondary | ICD-10-CM | POA: Insufficient documentation

## 2014-07-04 DIAGNOSIS — K088 Other specified disorders of teeth and supporting structures: Secondary | ICD-10-CM | POA: Insufficient documentation

## 2014-07-04 DIAGNOSIS — K0889 Other specified disorders of teeth and supporting structures: Secondary | ICD-10-CM

## 2014-07-04 DIAGNOSIS — K029 Dental caries, unspecified: Secondary | ICD-10-CM

## 2014-07-04 MED ORDER — HYDROCODONE-ACETAMINOPHEN 5-325 MG PO TABS: 1.0000 | ORAL_TABLET | Freq: Four times a day (QID) | ORAL | Status: DC | PRN

## 2014-07-04 MED ORDER — HYDROCODONE-ACETAMINOPHEN 5-325 MG PO TABS
1.0000 | ORAL_TABLET | Freq: Once | ORAL | Status: AC
  Administered 2014-07-04: 1 via ORAL
  Filled 2014-07-04: qty 1

## 2014-07-04 MED ORDER — PENICILLIN V POTASSIUM 500 MG PO TABS: 500.0000 mg | ORAL_TABLET | Freq: Three times a day (TID) | ORAL | Status: AC

## 2014-07-04 NOTE — ED Provider Notes (Signed)
CSN: [id number]     Arrival date & time 07/04/14  0904 History  This chart was scribed for non-physician practitioner, Terri Piedraourtney Forcucci, PA-C,working with Gwyneth SproutWhitney Plunkett, MD, by Karle PlumberJennifer Tensley, ED Scribe. This patient was seen in room TR07C/TR07C and the patient's care was started at 9:42 AM.  Chief Complaint  Patient presents with  . Dental Pain   Patient is a 33 y.o. female presenting with tooth pain. The history is provided by the patient. No language interpreter was used.  Dental Pain   HPI Comments:  [name] is a 33 y.o. female who presents to the Emergency Department complaining of severe left lower dental pain that began two days ago. She reports having a fractured tooth. Pt reports associated swelling of the surrounding gingiva. She states she has taken Aleve, Advil and tramadol for the pain with minimal relief. Denies inability to swallow, SOB, fever, chills, nausea or vomiting. She does not have a Education officer, communitydentist.  Past Medical History  Diagnosis Date  . BV (bacterial vaginosis)   . Tinea corporis   . Chlamydia    Past Surgical History  Procedure Laterality Date  . Tubal ligation     Family History  Problem Relation Age of Onset  . Stroke Other   . Diabetes Other   . Hypertension Other    History  Substance Use Topics  . Smoking status: Never Smoker   . Smokeless tobacco: Not on file  . Alcohol Use: Yes     Comment: socially   OB History    No data available     Review of Systems  HENT: Positive for dental problem. Negative for trouble swallowing.   All other systems reviewed and are negative.   Allergies  Review of patient's allergies indicates no known allergies.  Home Medications   Prior to Admission medications   Medication Sig Start Date End Date Taking? Authorizing Provider  ibuprofen (ADVIL,MOTRIN) 200 MG tablet Take 200 mg by mouth every 6 (six) hours as needed (for pain).   Yes Historical Provider, MD  naproxen sodium (ALEVE) 220 MG  tablet Take 220 mg by mouth 2 (two) times daily with a meal.   Yes Historical Provider, MD  HYDROcodone-acetaminophen (NORCO/VICODIN) 5-325 MG per tablet Take 1 tablet by mouth every 6 (six) hours as needed for moderate pain or severe pain. 07/04/14   Aldean Suddeth A Forcucci, PA-C  ondansetron (ZOFRAN ODT) 4 MG disintegrating tablet Take 1 tablet (4 mg total) by mouth every 6 (six) hours as needed for nausea. Patient not taking: Reported on 07/04/2014 11/01/12   Raeford RazorStephen Kohut, MD  penicillin v potassium (VEETID) 500 MG tablet Take 1 tablet (500 mg total) by mouth 3 (three) times daily. 07/04/14 07/11/14  Edrick Whitehorn A Forcucci, PA-C   BP 132/80 mmHg  Pulse 87  Temp(Src) 99 F (37.2 C) (Oral)  Resp 18  Ht 5\' 6"  (1.676 m)  Wt 185 lb (83.915 kg)  BMI 29.87 kg/m2  SpO2 100%  LMP 06/27/2014 Physical Exam  Constitutional: She is oriented to person, place, and time. She appears well-developed and well-nourished.  HENT:  Head: Normocephalic and atraumatic.  Nose: Nose normal.  Mouth/Throat: Uvula is midline, oropharynx is clear and moist and mucous membranes are normal. No trismus in the jaw. Dental caries present.    Poor dentition throughout.  Eyes: EOM are normal. Pupils are equal, round, and reactive to light.  Neck: Normal range of motion.  Cardiovascular: Normal rate, regular rhythm and normal heart sounds.  Exam reveals  no gallop and no friction rub.   No murmur heard. Pulmonary/Chest: Effort normal and breath sounds normal. No respiratory distress. She has no wheezes. She has no rales.  Musculoskeletal: Normal range of motion.  Neurological: She is alert and oriented to person, place, and time.  Skin: Skin is warm and dry.  Psychiatric: She has a normal mood and affect. Her behavior is normal.  Nursing note and vitals reviewed.   ED Course  Procedures (including critical care time) DIAGNOSTIC STUDIES:   COORDINATION OF CARE: 9:48 AM- Will refer to oral surgeon and prescribe PCN and  pain medication. Pt verbalizes understanding and agrees to plan.  Medications  HYDROcodone-acetaminophen (NORCO/VICODIN) 5-325 MG per tablet 1 tablet (not administered)    Labs Review Labs Reviewed - No data to display  Imaging Review No results found.   EKG Interpretation None      MDM   Final diagnoses:  Pain, dental  Complex dental caries   Patient is a 33 year old female who presents to the emergency room for left lower dental pain. Vital signs are stable. Physical exam reveals no evidence of trismus, facial swelling, or airway compromise. There is no evidence of ludwigs angina at this time. We will discharge the patient home with a short course of hydrocodone 5/325 #10, and penicillin V. patient to return for worsening shortness of breath, neck swelling, trismus, or any other concerning symptoms. I have instructed her to follow up with a dentist of her choosing. Patient states understanding and agreement at this time. Patient is stable for discharge.   I personally performed the services described in this documentation, which was scribed in my presence. The recorded information has been reviewed and is accurate.    Eben Burowourtney A Forcucci, PA-C 07/04/14 [phone]  Gwyneth SproutWhitney Plunkett, MD 07/04/14 1505

## 2014-07-04 NOTE — ED Notes (Signed)
Patient states broken tooth L lower x 3 months ago.   Patient states started hurting Wednesday.  Patient states "I took aleve -2, tramadol - 2, ibuprofen 4, and advil 1yesterday".

## 2014-07-04 NOTE — Discharge Instructions (Signed)
Dental Extraction A dental extraction procedure refers to a routine tooth extraction performed by your dentist. The procedure depends on where and how the tooth is positioned. The procedure can be very quick, sometimes lasting only seconds. Reasons for dental extraction include:  Tooth decay.  Infections (abscesses).  The need to make room for other teeth.  Gum diseases where the supporting bone has been destroyed.  Fractures of the tooth leaving it unrestorable.  Extra teeth (supernumerary) or grossly malformed teeth.  Baby teeththat have not fallen out in time and have not permitted the the permanent teeth to erupt properly.  In preparation for braces where there is not enough room to align the teeth properly.  Not enough room for wisdom teeth (particularly those that are impacted).  Prior to receiving radiation to the head and neck,teeth in the field of radiation may need to be extracted. LET YOUR CAREGIVER KNOW ABOUT:  Any allergies.  All medicines you are taking:  Including herbs, eye drops, over-the-counter medications, and creams.  Blood thinners (anticoagulants), aspirin, or other drugs that may affect blood clotting.  Use of steroids (through mouth or as creams).  Previous problems with anesthetics, including local anesthetics.  History of bleeding or blood problems.  Previous surgery.  Possibility of pregnancy if this applies.  Smoking history.  Any health problems. RISKS AND COMPLICATIONS As with any procedure, complications may occur, but they can usually be managed by your caregiver. General surgical complications may include:  Reaction to anesthesia.  Damage to surrounding teeth, nerves, tissues, or structures.  Infection.  Bleeding. With appropriate treatment and care after surgery, the following complications are very uncommon:  Dry socket (blood clot does not form or stay in place over empty socket). This can delay healing.  Incomplete  extraction of roots.  Jawbone injury, pain, or weakness. BEFORE THE PROCEDURE  Your dental care provider will:  Take a medical and dental history.  Take an X-ray to evaluate the circumstances and how to best extract the tooth.  Do an oral exam.  Depending on the situation, antibiotics may be given before or after the extraction.  Your caregivers may review the procedure, the local anesthesia and/or sedation being used, and what to expect after the procedure with you.  If needed, your dentist may give you a form of sedation, either by medicine you swallow, gas, or intravenously (IV). This will help to relieve anxiety. Complicated extractions may require the use of general anesthesia. It is important to follow your caregiver's instructions prior to your procedure to avoid complications. Steps before your procedure may include:  Alert your caregiver if you feel ill (sore throat, fever, upset stomach, etc.) in the days leading up to your procedure.  Stop taking certain medications for several days prior to your procedure such as blood thinners.  Take certain medications, such as antibiotics.  Avoid eating and drinking for several hours before the procedure. This will help you to avoid complications from the sedation or anesthesia.  Sign a patient consent form.  Have a friend or family member drive you to the dentist and drive you home after the procedure.  Wear comfortable, loose clothing. Limit makeup and jewelry.  Quit smoking. If you are a smoker, this will raise the chances of a healing problem after your procedure. If you are thinking about quitting, talk to your surgeon about how long before the operation you should stop smoking. You may also get help from your primary caregiver. PROCEDURE Dental extraction is typically done  as an outpatient procedure. IV sedation, local anesthesia, or both may be used. It will keep you comfortable and free of pain during the procedure.    There are 2 types of extractions:  Simple extraction involves a tooth that is visible in the mouth and above the gum line. After local anesthetic is given by injection, and the area is numbed, the dentist will loosen the tooth with a special instrument (elevator). Then another instrument (forceps) will be used to grasp the tooth and remove it from its socket. During the procedure you will feel some pressure, but you should not feel pain. If you do feel pain, tell your dentist. The open socket will be cleaned. Dressings (gauze) will be placed in the socket to reduce bleeding.  Surgical extractions are used if the tooth has not come into the mouth or the tooth is broken off below the gum line. The dentist will make a cut (incision) in the gum and may have to remove some of the bone around the tooth to aid in the removal of the tooth. After removal, stitches (sutures) may be required to close the area to help in healing and control bleeding. For some surgical extractions, you may need a general anesthetic or IV sedation (through the vein). After both types of extractions, you may be given pain medication or other drugs to help healing. Other postoperative instructions will be given by your dental caregiver. AFTER THE PROCEDURE  You will have gauze in your mouth where the tooth was removed. Gentle pressure on the gauze for up to 1 hour will help to control bleeding.  A blood clot will begin to form over the open socket. This is normal. Do not touch the area or rinse it.  Your pain will be controlled with medication and self-care.  You will be given detailed instructions for care after surgery. PROGNOSIS While some discomfort is normal after tooth extraction, most patients recover fully in just a few days. SEEK IMMEDIATE DENTAL CARE  You have uncontrolled bleeding, marked swelling, or severe pain.  You develop a fever, difficulty swallowing, or other severe symptoms.  You have questions or  concerns. Document Released: 08/01/2005 Document Revised: 12/16/2013 Document Reviewed: 11/05/2010 [facility] Patient Information 2015 [place], [place]. This information is not intended to replace advice given to you by your health care provider. Make sure you discuss any questions you have with your health care provider.   Dental Caries Dental caries (also called tooth decay) is the most common oral disease. It can occur at any age but is more common in children and young adults.  HOW DENTAL CARIES DEVELOPS  The process of decay begins when bacteria and foods (particularly sugars and starches) combine in your mouth to produce plaque. Plaque is a substance that sticks to the hard, outer surface of a tooth (enamel). The bacteria in plaque produce acids that attack enamel. These acids may also attack the root surface of a tooth (cementum) if it is exposed. Repeated attacks dissolve these surfaces and create holes in the tooth (cavities). If left untreated, the acids destroy the other layers of the tooth.  RISK FACTORS  Frequent sipping of sugary beverages.   Frequent snacking on sugary and starchy foods, especially those that easily get stuck in the teeth.   Poor oral hygiene.   Dry mouth.   Substance abuse such as methamphetamine abuse.   Broken or poor-fitting dental restorations.   Eating disorders.   Gastroesophageal reflux disease (GERD).   Certain radiation  treatments to the head and neck. SYMPTOMS In the early stages of dental caries, symptoms are seldom present. Sometimes white, chalky areas may be seen on the enamel or other tooth layers. In later stages, symptoms may include:  Pits and holes on the enamel.  Toothache after sweet, hot, or cold foods or drinks are consumed.  Pain around the tooth.  Swelling around the tooth. DIAGNOSIS  Most of the time, dental caries is detected during a regular dental checkup. A diagnosis is made after a thorough medical and dental  history is taken and the surfaces of your teeth are checked for signs of dental caries. Sometimes special instruments, such as lasers, are used to check for dental caries. Dental X-ray exams may be taken so that areas not visible to the eye (such as between the contact areas of the teeth) can be checked for cavities.  TREATMENT  If dental caries is in its early stages, it may be reversed with a fluoride treatment or an application of a remineralizing agent at the dental office. Thorough brushing and flossing at home is needed to aid these treatments. If it is in its later stages, treatment depends on the location and extent of tooth destruction:   If a small area of the tooth has been destroyed, the destroyed area will be removed and cavities will be filled with a material such as gold, silver amalgam, or composite resin.   If a large area of the tooth has been destroyed, the destroyed area will be removed and a cap (crown) will be fitted over the remaining tooth structure.   If the center part of the tooth (pulp) is affected, a procedure called a root canal will be needed before a filling or crown can be placed.   If most of the tooth has been destroyed, the tooth may need to be pulled (extracted). HOME CARE INSTRUCTIONS You can prevent, stop, or reverse dental caries at home by practicing good oral hygiene. Good oral hygiene includes:  Thoroughly cleaning your teeth at least twice a day with a toothbrush and dental floss.   Using a fluoride toothpaste. A fluoride mouth rinse may also be used if recommended by your dentist or health care provider.   Restricting the amount of sugary and starchy foods and sugary liquids you consume.   Avoiding frequent snacking on these foods and sipping of these liquids.   Keeping regular visits with a dentist for checkups and cleanings. PREVENTION   Practice good oral hygiene.  Consider a dental sealant. A dental sealant is a coating material that  is applied by your dentist to the pits and grooves of teeth. The sealant prevents food from being trapped in them. It may protect the teeth for several years.  Ask about fluoride supplements if you live in a community without fluorinated water or with water that has a low fluoride content. Use fluoride supplements as directed by your dentist or health care provider.  Allow fluoride varnish applications to teeth if directed by your dentist or health care provider. Document Released: 04/23/2002 Document Revised: 12/16/2013 Document Reviewed: 08/03/2012 [facility] Patient Information 2015 [place], [place]. This information is not intended to replace advice given to you by your health care provider. Make sure you discuss any questions you have with your health care provider.   Dental Pain A tooth ache may be caused by cavities (tooth decay). Cavities expose the nerve of the tooth to air and hot or cold temperatures. It may come from an infection  or abscess (also called a boil or furuncle) around your tooth. It is also often caused by dental caries (tooth decay). This causes the pain you are having. DIAGNOSIS  Your caregiver can diagnose this problem by exam. TREATMENT   If caused by an infection, it may be treated with medications which kill germs (antibiotics) and pain medications as prescribed by your caregiver. Take medications as directed.  Only take over-the-counter or prescription medicines for pain, discomfort, or fever as directed by your caregiver.  Whether the tooth ache today is caused by infection or dental disease, you should see your dentist as soon as possible for further care. SEEK MEDICAL CARE IF: The exam and treatment you received today has been provided on an emergency basis only. This is not a substitute for complete medical or dental care. If your problem worsens or new problems (symptoms) appear, and you are unable to meet with your dentist, call or return to this location. SEEK  IMMEDIATE MEDICAL CARE IF:   You have a fever.  You develop redness and swelling of your face, jaw, or neck.  You are unable to open your mouth.  You have severe pain uncontrolled by pain medicine. MAKE SURE YOU:   Understand these instructions.  Will watch your condition.  Will get help right away if you are not doing well or get worse. Document Released: 08/01/2005 Document Revised: 10/24/2011 Document Reviewed: 03/19/2008 [facility] Patient Information 2015 [place], [place]. This information is not intended to replace advice given to you by your health care provider. Make sure you discuss any questions you have with your health care provider.   Emergency Department Resource Guide 1) Find a Doctor and Pay Out of Pocket Although you won't have to find out who is covered by your insurance plan, it is a good idea to ask around and get recommendations. You will then need to call the office and see if the doctor you have chosen will accept you as a new patient and what types of options they offer for patients who are self-pay. Some doctors offer discounts or will set up payment plans for their patients who do not have insurance, but you will need to ask so you aren't surprised when you get to your appointment.  2) Contact Your Local Health Department Not all health departments have doctors that can see patients for sick visits, but many do, so it is worth a call to see if yours does. If you don't know where your local health department is, you can check in your phone book. The CDC also has a tool to help you locate your state's health department, and many state websites also have listings of all of their local health departments.  3) Find a Walk-in Clinic If your illness is not likely to be very severe or complicated, you may want to try a walk in clinic. These are popping up all over the country in pharmacies, drugstores, and shopping centers. They're usually staffed by nurse practitioners or  physician assistants that have been trained to treat common illnesses and complaints. They're usually fairly quick and inexpensive. However, if you have serious medical issues or chronic medical problems, these are probably not your best option.  No Primary Care Doctor: - Call Health Connect at  [phone] - they can help you locate a primary care doctor that  accepts your insurance, provides certain services, etc. - Physician Referral Service- [phone]  Chronic Pain Problems: Organization         Address  Phone   Notes  [facility]  [phone] Patients need to be referred by their primary care doctor.   Medication Assistance: Organization         Address  Phone   Notes  [facility] Medication [facility] [address] [place] [place], [place] [address] [phone] --Must be a resident of [facility] -- Must have NO insurance coverage whatsoever (no Medicaid/ Medicare, etc.) -- The pt. MUST have a primary care doctor that directs their care regularly and follows them in the community   MedAssist  [phone]   [employer]  [phone]    Agencies that provide inexpensive medical care: Organization         Address  Phone   Notes  [facility]  [phone]   [facility]    [phone]   [facility] [address] [place], [place] [address] [phone]   [facility] [address], [place] [phone]   Planned Parenthood    [phone]   [facility]    [phone]   [facility]  [address], [place] Phone:  [phone], Fax:  [phone] Hours of Operation:  9 am - 6 pm, M-F.  Also accepts Medicaid/Medicare and self-pay.  [facility]  [address], [place] Phone: [phone], Fax: [phone]. Hours of Operation:  8:30 am - 5:30 pm, M-F.   Also accepts Medicaid and self-pay.  [facility] [place] [address] [place] Phone: [phone]   [facility] [address] Natasha Bence [place], [place] [phone], Ext. [phone] Mondays & Thursdays: 7-9 AM.  First 15 patients are seen on a first come, first serve basis.    Medicaid-accepting [facility] Providers:  Organization         Address  Phone   Notes  [facility] [address], [place] [phone] Also accepts self-pay patients.  [facility] [address] Laurell Josephs [place], [place]  [phone]   [facility] [address], [place] [phone]   [facility] [address], [place] [phone]   Renaye Rakers [address], [place]   [phone] Only accepts [place] Access [place] patients after they have their name applied to their card.   Self-Pay (no insurance) in [facility]:  Organization         Address  Phone   Notes  Sickle Cell Patients, [facility] [address] [place], [place] [phone]   [facility] [address] [place], [place] [phone]   [facility] [place]  [address], [place] [phone]   [facility]/Dr. Osei-Bonsu  [address] [place] or [address], [place] [phone] Phone number for both [place] and [place] locations is the same.  [facility] [address], [place] [phone]   [facility] [address] [place] or [address] [phone]   [facility] [address] [place] [phone], phone; [phone], fax Sees patients 1st and 3rd Saturday of every month.  Must not qualify for public or  private insurance (i.e. Medicaid, Medicare, [place] Health Choice, Veterans'  Benefits)  Household income should be no more than 200% of the poverty level The clinic cannot treat you if you are pregnant or think you are pregnant  Sexually transmitted diseases are not treated at the clinic.    Dental Care: Organization         Address  Phone  Notes  [facility] Department of [facility] [address] [place], [place] [phone] Accepts children up to age 13 who are enrolled in [place] or [place] Health Choice; pregnant women with a Medicaid card; and children who have applied for Medicaid or [place] Health Choice, but were declined, whose parents can pay a reduced fee at time of service.  [facility] Department of [facility]  [address], [place] [phone] Accepts children up to age 39 who are enrolled in [place] or [place] Health Choice; pregnant women with a Medicaid card; and children who have applied for Medicaid or [place] Health Choice, but were declined, whose parents can pay a reduced fee at time of service.  [facility]  [address] [place], [place] [phone] Patients are seen by appointment only. Walk-ins are not accepted. [facility] will see patients 51 years of age and older. Monday - Tuesday (8am-5pm) Most Wednesdays (8:30-5pm) $30 per visit, cash only  [facility]  [address], [facility] [phone] Patients are seen by appointment only. Walk-ins are not accepted. [facility] will see patients 64 years of age and older. One Wednesday Evening (Monthly: Volunteer Based).  $30 per visit, cash only  [employer]  [phone] for adults; Children under age 39, call Graduate Pediatric Dentistry at [phone]. Children aged 32-14, please call [phone] to request a pediatric application.  Dental services are provided in all areas of dental care including fillings, crowns and bridges, complete and partial  dentures, implants, gum treatment, root canals, and extractions. Preventive care is also provided. Treatment is provided to both adults and children. Patients are selected via a lottery and there is often a waiting list.   [facility] [address] [place]  [phone] [email or web address]   [facility] [address] [place], [place] [phone], Ext. [phone] Second and Fourth Thursday of each month, opens at 6:30 AM; Clinic ends at 9 AM.  Patients are seen on a first-come first-served basis, and a limited number are seen during each clinic.   [facility]  [address] Ether Griffins [place], [place] [phone]   Eligibility Requirements You must have lived in [place], [place], or [place] counties for at least the last three months.   You cannot be eligible for state or federal sponsored [place], including [employer], [place], or [employer].   You generally cannot be eligible for healthcare insurance through your employer.    How to apply: Eligibility screenings are held every Tuesday and Wednesday afternoon from 1:00 pm until 4:00 pm. You do not need an appointment for the interview!  [facility] [address], [place], [place] [phone]   [facility] Health Department  [phone]   [facility] Health Department  [phone]   [facility] Health Department  [phone]    Behavioral Health Resources in the Community: Intensive Outpatient Programs Organization         Address  Phone  Notes  [place] [address] [place], [place] [phone]   [facility] Outpatient [address], [place], [place]   ADS: [facility] Drug S[facility] [address] [place], [place]   [place] [address]  [place], [place] or [phone]   Substance Abuse Resources Organization          Address  Phone  Notes  [facility] Drug Services  [phone]   [place]  [phone]   [facility] [place]   Chinita Pester  [phone]   Residential & Outpatient Substance Abuse Program  [phone]   Psychological Services Organization         Address  Phone  Notes  [facility] [place]  [place]  [phone]   [place] [address], [place] or [phone]    Mobile Crisis Teams Organization         Address  Phone  Notes  [facility], Mobile Crisis Care Unit  [phone]   [facility]  [address] [place], [place]   [place] Levels [address], [place] [phone]    Self-Help/Support Groups Organization         Address  Phone             Notes  [place]. of [place] - variety of support groups  [place] Call for more information  Narcotics Anonymous (NA), [facility] [address], [employer] [place]  2 meetings at this location   Special educational needs teacher         Address  Phone  Notes  [facility] [place],    [place]  [phone]   [facility]  [address] [place] [id number], [place], [place]   [place], [place] [phone] Admissions: 8am-3pm M-F  Incentives Substance [place] [address]    [place], [place] [phone]   [facility] [address] [place], [place], [place]   [facility] [address]  [place], [place]   [facility] - Intensive Outpatient [address] Kristeen Mans [address], [place], [place]   [facility] ([place]  [place], [place] [phone] or [phone]   [facility] ([facility]) [address] [place], [place] Accepts Medicaid  [facility] [place] [address],  [place] [phone] Substance Abuse/Addiction Treatment   [facility] Organization         Address  Phone  Notes  [facility]  [phone]   Domenic Schwab, PhD [address] Arlis Porta [place], [place]   [phone] or [phone]   [facility] [place], [place] [phone]   [facility] [address] [place], [place] [phone] Insurance/Medicaid/sponsorship through [facility] and Families [address]                                    [place], [place] [phone] [place] [address], [place] [phone]    Dr. Adele Schilder  [phone]   [facility]  Health Dept. 1) [address] [place] 2) [place] 3)  [place] [address], [place] [phone]  [phone]   [place] [phone] or [phone] (After Hours)

## 2014-11-11 ENCOUNTER — Emergency Department (HOSPITAL_COMMUNITY)
Admission: EM | Admit: 2014-11-11 | Discharge: 2014-11-11 | Disposition: A | Discharge status: Home / Self Care | Payer: Medicaid Other | Attending: Emergency Medicine | Admitting: Emergency Medicine

## 2014-11-11 ENCOUNTER — Encounter (HOSPITAL_COMMUNITY): Payer: Self-pay | Admitting: Emergency Medicine

## 2014-11-11 DIAGNOSIS — Z791 Long term (current) use of non-steroidal anti-inflammatories (NSAID): Secondary | ICD-10-CM | POA: Insufficient documentation

## 2014-11-11 DIAGNOSIS — Z8742 Personal history of other diseases of the female genital tract: Secondary | ICD-10-CM | POA: Diagnosis not present

## 2014-11-11 DIAGNOSIS — K0889 Other specified disorders of teeth and supporting structures: Secondary | ICD-10-CM

## 2014-11-11 DIAGNOSIS — Z8619 Personal history of other infectious and parasitic diseases: Secondary | ICD-10-CM | POA: Diagnosis not present

## 2014-11-11 DIAGNOSIS — K089 Disorder of teeth and supporting structures, unspecified: Secondary | ICD-10-CM | POA: Diagnosis present

## 2014-11-11 DIAGNOSIS — K047 Periapical abscess without sinus: Secondary | ICD-10-CM | POA: Insufficient documentation

## 2014-11-11 MED ORDER — AMOXICILLIN 500 MG PO CAPS: 500.0000 mg | ORAL_CAPSULE | Freq: Three times a day (TID) | ORAL | Status: DC

## 2014-11-11 NOTE — Discharge Instructions (Signed)
Take amoxicillin as directed for 7 days. Follow-up with the dentist. Continue taking naproxen twice daily for your dental pain.  Dental Pain A tooth ache may be caused by cavities (tooth decay). Cavities expose the nerve of the tooth to air and hot or cold temperatures. It may come from an infection or abscess (also called a boil or furuncle) around your tooth. It is also often caused by dental caries (tooth decay). This causes the pain you are having. DIAGNOSIS  Your caregiver can diagnose this problem by exam. TREATMENT   If caused by an infection, it may be treated with medications which kill germs (antibiotics) and pain medications as prescribed by your caregiver. Take medications as directed.  Only take over-the-counter or prescription medicines for pain, discomfort, or fever as directed by your caregiver.  Whether the tooth ache today is caused by infection or dental disease, you should see your dentist as soon as possible for further care. SEEK MEDICAL CARE IF: The exam and treatment you received today has been provided on an emergency basis only. This is not a substitute for complete medical or dental care. If your problem worsens or new problems (symptoms) appear, and you are unable to meet with your dentist, call or return to this location. SEEK IMMEDIATE MEDICAL CARE IF:   You have a fever.  You develop redness and swelling of your face, jaw, or neck.  You are unable to open your mouth.  You have severe pain uncontrolled by pain medicine. MAKE SURE YOU:   Understand these instructions.  Will watch your condition.  Will get help right away if you are not doing well or get worse. Document Released: 08/01/2005 Document Revised: 10/24/2011 Document Reviewed: 03/19/2008 [facility] Patient Information 2015 [place], [place]. This information is not intended to replace advice given to you by your health care provider. Make sure you discuss any questions you have with your health care  provider.  Facial Infection You have an infection of your face. This requires special attention to help prevent serious problems. Infections in facial wounds can cause poor healing and scars. They can also spread to deeper tissues, especially around the eye. Wound and dental infections can lead to sinusitis, infection of the eye socket, and even meningitis. Permanent damage to the skin, eye, and nervous system may result if facial infections are not treated properly. With severe infections, hospital care for IV antibiotic injections may be needed if they don't respond to oral antibiotics. Antibiotics must be taken for the full course to insure the infection is eliminated. If the infection came from a bad tooth, it may have to be extracted when the infection is under control. Warm compresses may be applied to reduce skin irritation and remove drainage. You might need a tetanus shot now if:  You cannot remember when your last tetanus shot was.  You have never had a tetanus shot.  The object that caused your wound was dirty. If you need a tetanus shot, and you decide not to get one, there is a rare chance of getting tetanus. Sickness from tetanus can be serious. If you got a tetanus shot, your arm may swell, get red and warm to the touch at the shot site. This is common and not a problem. SEEK IMMEDIATE MEDICAL CARE IF:   You have increased swelling, redness, or trouble breathing.  You have a severe headache, dizziness, nausea, or vomiting.  You develop problems with your eyesight.  You have a fever. Document Released: 09/08/2004 Document  Revised: 10/24/2011 Document Reviewed: 08/01/2005 ExitCare Patient Information 2015 [place], [place]. This information is not intended to replace advice given to you by your health care provider. Make sure you discuss any questions you have with your health care provider.

## 2014-11-11 NOTE — ED Notes (Signed)
Pt reports sharp, throbbibng r/lower jaw pain x 4 days. reports hx of dental problems

## 2014-11-11 NOTE — ED Provider Notes (Signed)
CSN: [id number]     Arrival date & time 11/11/14  1207 History  This chart was scribed for non-physician practitioner, Celene Skeenobyn Oran Dillenburg, PA-C working with Raeford RazorStephen Kohut, MD by Greggory StallionKayla Andersen, ED scribe. This patient was seen in room WTR8/WTR8 and the patient's care was started at 12:32 PM.   Chief Complaint  Patient presents with  . Dental Pain    pain in r/lower  mouth   The history is provided by the patient. No language interpreter was used.    HPI Comments: [name] is a 34 y.o. female who presents to the Emergency Department complaining of worsening, sharp, throbbing, right lower dental pain that started 4 days ago. Chewing worsens pain. She has taken aleve with some relief of pain. Pt does not currently have a dentist and hasn't seen one since 2014. She denies fever, facial swelling.   Past Medical History  Diagnosis Date  . BV (bacterial vaginosis)   . Tinea corporis   . Chlamydia    Past Surgical History  Procedure Laterality Date  . Tubal ligation     Family History  Problem Relation Age of Onset  . Stroke Other   . Diabetes Other   . Hypertension Other    History  Substance Use Topics  . Smoking status: Never Smoker   . Smokeless tobacco: Not on file  . Alcohol Use: Yes     Comment: socially   OB History    No data available     Review of Systems  Constitutional: Negative for fever.  HENT: Positive for dental problem. Negative for facial swelling.   All other systems reviewed and are negative.  Allergies  Review of patient's allergies indicates no known allergies.  Home Medications   Prior to Admission medications   Medication Sig Start Date End Date Taking? Authorizing Provider  amoxicillin (AMOXIL) 500 MG capsule Take 1 capsule (500 mg total) by mouth 3 (three) times daily. 11/11/14   Kathrynn Speedobyn M Shambria Camerer, PA-C  HYDROcodone-acetaminophen (NORCO/VICODIN) 5-325 MG per tablet Take 1 tablet by mouth every 6 (six) hours as needed for moderate pain or severe pain.  07/04/14   Courtney Forcucci, PA-C  ibuprofen (ADVIL,MOTRIN) 200 MG tablet Take 200 mg by mouth every 6 (six) hours as needed (for pain).    Historical Provider, MD  naproxen sodium (ALEVE) 220 MG tablet Take 220 mg by mouth 2 (two) times daily with a meal.    Historical Provider, MD  ondansetron (ZOFRAN ODT) 4 MG disintegrating tablet Take 1 tablet (4 mg total) by mouth every 6 (six) hours as needed for nausea. Patient not taking: Reported on 07/04/2014 11/01/12   Raeford RazorStephen Kohut, MD   BP 134/78 mmHg  Pulse 66  Temp(Src) 98.4 F (36.9 C) (Oral)  Resp 18  SpO2 100%  LMP 11/04/2014 (Exact Date)   Physical Exam  Constitutional: She is oriented to person, place, and time. She appears well-developed and well-nourished. No distress.  HENT:  Head: Normocephalic and atraumatic.  Mouth/Throat: Oropharynx is clear and moist.    TTP over R lower molars #18-20 with surrounding gingival inflammation. No dental abscess. No s/s Ludwig's angina.  Eyes: Conjunctivae and EOM are normal.  Neck: Normal range of motion. Neck supple.  Cardiovascular: Normal rate, regular rhythm and normal heart sounds.   Pulmonary/Chest: Effort normal and breath sounds normal. No respiratory distress.  Musculoskeletal: Normal range of motion. She exhibits no edema.  Neurological: She is alert and oriented to person, place, and time. No sensory deficit.  Skin: Skin is warm and dry.  Psychiatric: She has a normal mood and affect. Her behavior is normal.  Nursing note and vitals reviewed.   ED Course  Procedures (including critical care time)  DIAGNOSTIC STUDIES: Oxygen Saturation is 100% on RA, normal by my interpretation.    COORDINATION OF CARE: 12:33 PM-Discussed treatment plan which includes an antibiotic and naproxen with pt at bedside and pt agreed to plan. Will give pt dental referrals and advised her to follow up.   Labs Review Labs Reviewed - No data to display  Imaging Review No results found.   EKG  Interpretation None      MDM   Final diagnoses:  Dental infection  Pain, dental    Dental pain associated with dental infection. No evidence of dental abscess or Ludwig's angina. Patient is afebrile, non toxic appearing and swallowing secretions well. I gave patient referral to dentist and stressed the importance of dental follow up for ultimate management of dental pain. I will also give amoxicillin and pain control with naproxen. Patient voices understanding and is agreeable to plan.  I personally performed the services described in this documentation, which was scribed in my presence. The recorded information has been reviewed and is accurate.  Kathrynn Speed, PA-C 11/11/14 1250  Raeford Razor, MD 11/12/14 [phone]

## 2015-05-12 ENCOUNTER — Emergency Department (HOSPITAL_COMMUNITY)
Admission: EM | Admit: 2015-05-12 | Discharge: 2015-05-12 | Disposition: A | Discharge status: Home / Self Care | Payer: Medicaid Other | Attending: Emergency Medicine | Admitting: Emergency Medicine

## 2015-05-12 ENCOUNTER — Encounter (HOSPITAL_COMMUNITY): Payer: Self-pay

## 2015-05-12 DIAGNOSIS — K088 Other specified disorders of teeth and supporting structures: Secondary | ICD-10-CM | POA: Diagnosis present

## 2015-05-12 DIAGNOSIS — Z862 Personal history of diseases of the blood and blood-forming organs and certain disorders involving the immune mechanism: Secondary | ICD-10-CM | POA: Insufficient documentation

## 2015-05-12 DIAGNOSIS — Z8619 Personal history of other infectious and parasitic diseases: Secondary | ICD-10-CM | POA: Diagnosis not present

## 2015-05-12 DIAGNOSIS — K029 Dental caries, unspecified: Secondary | ICD-10-CM | POA: Insufficient documentation

## 2015-05-12 DIAGNOSIS — Z8742 Personal history of other diseases of the female genital tract: Secondary | ICD-10-CM | POA: Diagnosis not present

## 2015-05-12 DIAGNOSIS — K0889 Other specified disorders of teeth and supporting structures: Secondary | ICD-10-CM

## 2015-05-12 MED ORDER — TRAMADOL HCL 50 MG PO TABS: 50.0000 mg | ORAL_TABLET | Freq: Four times a day (QID) | ORAL | Status: DC | PRN

## 2015-05-12 MED ORDER — HYDROCODONE-ACETAMINOPHEN 5-325 MG PO TABS
2.0000 | ORAL_TABLET | Freq: Once | ORAL | Status: AC
  Administered 2015-05-12: 2 via ORAL
  Filled 2015-05-12: qty 2

## 2015-05-12 NOTE — ED Notes (Signed)
Patient states she has a right lower broken tooth and worsening pain x 2 weeks. Patient has swelling of gums, but very little facial swelling.

## 2015-05-12 NOTE — Discharge Instructions (Signed)
Dental Pain Follow up with a dentist from one of the referrals that I have given you today or use the resource guide below to find a dentist. Return for any facial swelling or difficulty breathing. A tooth ache may be caused by cavities (tooth decay). Cavities expose the nerve of the tooth to air and hot or cold temperatures. It may come from an infection or abscess (also called a boil or furuncle) around your tooth. It is also often caused by dental caries (tooth decay). This causes the pain you are having. DIAGNOSIS  Your caregiver can diagnose this problem by exam. TREATMENT   If caused by an infection, it may be treated with medications which kill germs (antibiotics) and pain medications as prescribed by your caregiver. Take medications as directed.  Only take over-the-counter or prescription medicines for pain, discomfort, or fever as directed by your caregiver.  Whether the tooth ache today is caused by infection or dental disease, you should see your dentist as soon as possible for further care. SEEK MEDICAL CARE IF: The exam and treatment you received today has been provided on an emergency basis only. This is not a substitute for complete medical or dental care. If your problem worsens or new problems (symptoms) appear, and you are unable to meet with your dentist, call or return to this location. SEEK IMMEDIATE MEDICAL CARE IF:   You have a fever.  You develop redness and swelling of your face, jaw, or neck.  You are unable to open your mouth.  You have severe pain uncontrolled by pain medicine. MAKE SURE YOU:   Understand these instructions.  Will watch your condition.  Will get help right away if you are not doing well or get worse. Document Released: 08/01/2005 Document Revised: 10/24/2011 Document Reviewed: 03/19/2008 [facility] Patient Information 2015 [place], [place]. This information is not intended to replace advice given to you by your health care provider. Make sure  you discuss any questions you have with your health care provider.  Emergency Department Resource Guide 1) Find a Doctor and Pay Out of Pocket Although you won't have to find out who is covered by your insurance plan, it is a good idea to ask around and get recommendations. You will then need to call the office and see if the doctor you have chosen will accept you as a new patient and what types of options they offer for patients who are self-pay. Some doctors offer discounts or will set up payment plans for their patients who do not have insurance, but you will need to ask so you aren't surprised when you get to your appointment.  2) Contact Your Local Health Department Not all health departments have doctors that can see patients for sick visits, but many do, so it is worth a call to see if yours does. If you don't know where your local health department is, you can check in your phone book. The CDC also has a tool to help you locate your state's health department, and many state websites also have listings of all of their local health departments.  3) Find a Walk-in Clinic If your illness is not likely to be very severe or complicated, you may want to try a walk in clinic. These are popping up all over the country in pharmacies, drugstores, and shopping centers. They're usually staffed by nurse practitioners or physician assistants that have been trained to treat common illnesses and complaints. They're usually fairly quick and inexpensive. However, if you have  serious medical issues or chronic medical problems, these are probably not your best option.  No Primary Care Doctor: - Call Health Connect at  [phone] - they can help you locate a primary care doctor that  accepts your insurance, provides certain services, etc. - Physician Referral Service- [phone]  Chronic Pain Problems: Organization         Address  Phone   Notes  [facility]  [phone]) [phone] Patients need to  be referred by their primary care doctor.   Medication Assistance: Organization         Address  Phone   Notes  [facility] Medication [facility] [address] [place], [place] [address] [phone] --Must be a resident of [facility] -- Must have NO insurance coverage whatsoever (no Medicaid/ Medicare, etc.) -- The pt. MUST have a primary care doctor that directs their care regularly and follows them in the community   MedAssist  [phone]) [phone]   [employer]  [phone]    Agencies that provide inexpensive medical care: Organization         Address  Phone   Notes  [facility]  [phone]   [facility]    [phone]) [phone]   [facility] [address] [place], [place] [address] [phone]) [phone]   [facility] [address], [place] [phone]   Planned Parenthood    [phone]   [facility]    [phone]   [facility]  [address], [place] Phone:  [phone], Fax:  [phone]) [phone] Hours of Operation:  9 am - 6 pm, M-F.  Also accepts Medicaid/Medicare and self-pay.  [facility]  [address], [place] Phone: [phone], Fax: [phone]. Hours of Operation:  8:30 am - 5:30 pm, M-F.  Also accepts Medicaid and self-pay.  [facility] [place] [address], [place] Phone: [phone]) [phone]   [facility] [address], [place], [place] [phone], Ext. [phone] Mondays & Thursdays: 7-9 AM.  First 15 patients are seen on a first come, first serve basis.    Medicaid-accepting [facility] Providers:  Organization         Address  Phone   Notes  [facility] [address], [place] [phone]) [phone] Also accepts self-pay patients.  [facility] [address], [place]  [phone]   [facility] [address], [place] [phone]) [phone]   [facility] [address] [place] [address], [place] [phone]   Renaye RakersVeita Bland [address], [place]   [phone]) [phone] Only accepts [place] Access [place] patients after they have their name applied to their card.   Self-Pay (no insurance) in [facility]:  Organization         Address  Phone   Notes  Sickle Cell Patients, [facility] [address], [place] [phone]   [facility] [address], [place] [phone]   [facility]  [address],  [phone]) [phone]   [facility]/Dr. Osei-Bonsu  [address] [place] [address], [place] or [phone], [place] [phone] Phone number for both [place] and [place] locations  is the same.  [facility] [address] [place] [phone]   [facility] [address] [place] or [address] [phone]   [facility] [address], [place] [phone], phone; [phone], fax Sees patients 1st and 3rd Saturday of every month.  Must not qualify for public or private insurance (i.e. Medicaid, Medicare, [place] Health Choice, Veterans' Benefits)  Household income should be no more than 200% of the poverty level The clinic cannot treat you if you are pregnant or think you are pregnant  Sexually transmitted diseases are not treated at the clinic.    Dental Care: Organization         Address  Phone  Notes  [facility] [address] [place], [place] [phone] Accepts children up to age 56 who are enrolled in [place] or [place] Health Choice; pregnant women with a Medicaid card; and children who have applied for Medicaid or [place] Health Choice, but were declined, whose parents can  pay a reduced fee at time of service.  [facility] of [facility]  [address], [place] [phone] Accepts children up to age 27 who are enrolled in [place] or [place] Health Choice; pregnant women with a Medicaid card; and children who have applied for Medicaid or [place] Health Choice, but were declined, whose parents can pay a reduced fee at time of service.  [facility]  [address] [place], [place] [phone] Patients are seen by appointment only. Walk-ins are not accepted. [facility] will see patients 59 years of age and older. Monday - Tuesday (8am-5pm) Most Wednesdays (8:30-5pm) $30 per visit, cash only  [facility] Access [facility]  [address], [facility] [phone] Patients are seen by appointment only. Walk-ins are not accepted. [facility] will see patients 72 years of age and older. One Wednesday Evening (Monthly: Volunteer Based).  $30 per visit, cash only  [employer]  [phone] for adults; Children under age 25, call Graduate Pediatric Dentistry at [phone]. Children aged 60-14, please call [phone] to request a pediatric application.  Dental services are provided in all areas of dental care including fillings, crowns and bridges, complete and partial dentures, implants, gum treatment, root canals, and extractions. Preventive care is also provided. Treatment is provided to both adults and children. Patients are selected via a lottery and there is often a waiting list.   [facility] [address] [place]  [phone] [email or web address]   [facility] [address] [place], [place] [phone], Ext. [phone] Second and Fourth Thursday of each month, opens at 6:30 AM; Clinic ends at 9 AM.  Patients are seen on a first-come first-served basis, and a limited number are seen during each clinic.   [facility]  [address] [place], [place] [phone]   Eligibility Requirements You must have lived in [place], [place], or [place] counties for at least the last three months.   You cannot be eligible for state or federal sponsored [place], including [employer], [place], or [employer].   You generally cannot be eligible for healthcare insurance through your employer.    How to apply: Eligibility screenings are held every Tuesday and Wednesday afternoon from 1:00 pm until 4:00 pm. You do not need an  appointment for the interview!  [facility] [address], [place], [place] [phone]   [facility]  [phone]   [facility]  [phone]   [facility]  [phone]    Behavioral Health Resources in the Community: Intensive Outpatient Programs Organization         Address  Phone  Notes  [facility] [address], [place], [place] [phone]   [facility] [address], [place], [place] [phone]   [facility] [address], [place], [place]  [phone]   [facility] [address],  [place], [place] [phone] or [phone]   Substance Abuse Resources Organization         Address  Phone  Notes  [facility]  [phone]   [facility]  [phone]   [facility]  [phone]   [facility]  [phone]   Residential & Outpatient Substance Abuse Program  [phone]   Psychological Services Organization         Address  Phone  Notes  [facility]  [phone]- [phone]   [facility]  [phone]- [phone]   [facility] [address], [place] [phone] or [phone]    Mobile Crisis Teams Organization         Address  Phone  Notes  [facility], Mobile Crisis Care Unit  [phone]    [facility]  [address] [place], [place] [phone]   Doristine LocksSharon DeEsch [address] [place] [phone]    Self-Help/Support Groups Organization         Address  Phone             Notes  [facility] [place] - variety of support groups  336- [phone] Call for more information  Narcotics Anonymous (NA), [facility] [address], [place]  2 meetings at this location   Statisticianesidential Treatment Programs Organization         Address  Phone  Notes  [facility] [address],    [place]  [phone]   [facility]  [address], [place] [phone], [place], [place] [phone]   [facility] [address], [place] [phone] Admissions: 8am-3pm M-F  [facility] [address]    [place], [place] [phone]   [facility] [address], [place], [place] [phone]   [facility] [address]  [place], [place] [phone]   [facility] - Intensive Outpatient [address], [place], [place] [phone]   [facility] ([facility] [address]  [place], [place] [phone] or [phone]   [facility] ([facility]) [address] [place], [place] [phone] Accepts Medicaid  [facility] [address]  [place] [phone] Substance Abuse/Addiction Treatment   [facility] Behavioral Health Resources Organization         Address  Phone  Notes  [facility]  [phone]) [phone]   Angie FavaJulie Brannon, PhD [address], [address] [place], [place]   [phone] or [phone]   [facility]   [address] [place], [place] [phone]   [facility] [address], [place], [place] [phone]) [phone] Insurance/Medicaid/sponsorship through [employer] [facility]  [facility] Families [address]  [place], [place] [phone] [place], [place] [phone]    Dr. Adele Schilder  [phone]   [facility] Dept. 1) [address] [place] 2) [place] 3)  [address], [place] [phone]  [phone]   [place] [phone] or [phone] (After Hours)

## 2015-05-12 NOTE — ED Provider Notes (Signed)
CSN: [id number]     Arrival date & time 05/12/15  1143 History  By signing my name below, I, [name], attest that this documentation has been prepared under the direction and in the presence of [employer], PA-C. Electronically Signed: Ronney Cantrell, ED Scribe. 05/12/2015. 12:39 PM.    Chief Complaint  Patient presents with  . Dental Pain   The history is provided by the patient. No language interpreter was used.    HPI Comments: [name] is a 34 y.o. female who presents to the Emergency Department complaining of constant, throbbing, moderate right lower dental pain from breaking a tooth while eating 2 weeks ago. She states she felt the tooth break off and immediately spit her food and her tooth out. Patient had tried Tylenol at home with minimal relief. She denies fever or chills.    Past Medical History  Diagnosis Date  . BV (bacterial vaginosis)   . Tinea corporis   . Chlamydia    Past Surgical History  Procedure Laterality Date  . Tubal ligation     Family History  Problem Relation Age of Onset  . Stroke Other   . Diabetes Other   . Hypertension Other    Social History  Substance Use Topics  . Smoking status: Never Smoker   . Smokeless tobacco: None  . Alcohol Use: Yes     Comment: socially   OB History    No data available     Review of Systems  Constitutional: Negative for fever and chills.  HENT: Positive for dental problem.    Allergies  Review of patient's allergies indicates no known allergies.  Home Medications   Prior to Admission medications   Medication Sig Start Date End Date Taking? Authorizing Provider  acetaminophen (TYLENOL) 500 MG tablet Take 1,500 mg by mouth every 6 (six) hours as needed for mild pain, moderate pain or headache.   Yes Historical Provider, MD  amoxicillin (AMOXIL) 500 MG capsule Take 1 capsule (500 mg total) by mouth 3 (three) times daily. Patient not taking: Reported on 05/12/2015 11/11/14   Nada Boozer Hess, PA-C   ondansetron (ZOFRAN ODT) 4 MG disintegrating tablet Take 1 tablet (4 mg total) by mouth every 6 (six) hours as needed for nausea. Patient not taking: Reported on 07/04/2014 11/01/12   Raeford Razor, MD  traMADol (ULTRAM) 50 MG tablet Take 1 tablet (50 mg total) by mouth every 6 (six) hours as needed. 05/12/15   Hanna Patel-Mills, PA-C   BP 141/77 mmHg  Pulse 76  Temp(Src) 98.4 F (36.9 C) (Oral)  Resp 16  Ht  (1.676 m)  Wt 183 lb (83.008 kg)  BMI 29.55 kg/m2  SpO2 100%  LMP 04/20/2015 Physical Exam  Constitutional: She is oriented to person, place, and time. She appears well-developed and well-nourished. No distress.  HENT:  Head: Normocephalic and atraumatic.  Right lower dental pain with old fractured tooth. No gum or facial swelling or fluctuance. Patent airway, with no anterior cervical swelling. She has dental caries, and poor dentition throughout, with multiple missing teeth.   Eyes: Conjunctivae and EOM are normal.  Neck: Neck supple. No tracheal deviation present.  Cardiovascular: Normal rate.   Pulmonary/Chest: Effort normal. No respiratory distress.  Musculoskeletal: Normal range of motion.  Neurological: She is alert and oriented to person, place, and time.  Skin: Skin is warm and dry.  Psychiatric: She has a normal mood and affect. Her behavior is normal.  Nursing note and vitals reviewed.  ED Course  Procedures (including critical care time)  DIAGNOSTIC STUDIES: Oxygen Saturation is 100% on RA, normal by my interpretation.    COORDINATION OF CARE: 12:39 PM - Discussed treatment plan with pt at bedside which includes Rx narcotic pain medications and dental resources given for follow-up. Pt verbalized understanding and agreed to plan.   MDM   Final diagnoses:  Pain, dental  Patient presents for dental pain. She is well-appearing and in no acute distress. Vital signs are stable. No signs of Ludwig's angina or deep tissue infection. I discussed return  precautions with the patient and she was given several dental referrals. She verbally agrees with the plan.  Rx: Tramadol  Medications  HYDROcodone-acetaminophen (NORCO/VICODIN) 5-325 MG per tablet 2 tablet (2 tablets Oral Given 05/12/15 1259)   I personally performed the services described in this documentation, which was scribed in my presence. The recorded information has been reviewed and is accurate.     Karen Gosselin, PA-C 05/13/15 1814  Karen Loveless, MD 05/14/15 [phone]

## 2015-07-04 ENCOUNTER — Encounter (HOSPITAL_COMMUNITY): Payer: Self-pay | Admitting: Emergency Medicine

## 2015-07-04 ENCOUNTER — Emergency Department (HOSPITAL_COMMUNITY)
Admission: EM | Admit: 2015-07-04 | Discharge: 2015-07-04 | Disposition: A | Discharge status: Home / Self Care | Payer: Medicaid Other | Attending: Emergency Medicine | Admitting: Emergency Medicine

## 2015-07-04 DIAGNOSIS — K0889 Other specified disorders of teeth and supporting structures: Secondary | ICD-10-CM | POA: Diagnosis present

## 2015-07-04 DIAGNOSIS — Z8619 Personal history of other infectious and parasitic diseases: Secondary | ICD-10-CM | POA: Insufficient documentation

## 2015-07-04 DIAGNOSIS — Z8742 Personal history of other diseases of the female genital tract: Secondary | ICD-10-CM | POA: Insufficient documentation

## 2015-07-04 DIAGNOSIS — K047 Periapical abscess without sinus: Secondary | ICD-10-CM | POA: Insufficient documentation

## 2015-07-04 HISTORY — DX: Other specified disorders of teeth and supporting structures: K08.89

## 2015-07-04 LAB — COMPREHENSIVE METABOLIC PANEL
ALBUMIN: 4.2 g/dL (ref 3.5–5.0)
ALT: 9 U/L — AB (ref 14–54)
AST: 19 U/L (ref 15–41)
Alkaline Phosphatase: 55 U/L (ref 38–126)
Anion gap: 7 (ref 5–15)
BUN: 17 mg/dL (ref 6–20)
CHLORIDE: 108 mmol/L (ref 101–111)
CO2: 23 mmol/L (ref 22–32)
CREATININE: 0.94 mg/dL (ref 0.44–1.00)
Calcium: 9 mg/dL (ref 8.9–10.3)
GFR calc Af Amer: >60 mL/min (ref >60)
GFR calc non Af Amer: >60 mL/min (ref >60)
GLUCOSE: 92 mg/dL (ref 65–99)
POTASSIUM: 3.7 mmol/L (ref 3.5–5.1)
SODIUM: 138 mmol/L (ref 135–145)
Total Bilirubin: 0.4 mg/dL (ref 0.3–1.2)
Total Protein: 8.1 g/dL (ref 6.5–8.1)

## 2015-07-04 LAB — ACETAMINOPHEN LEVEL: Acetaminophen (Tylenol), Serum: 13 ug/mL (ref 10–30)

## 2015-07-04 MED ORDER — HYDROCODONE-IBUPROFEN 7.5-200 MG PO TABS: 1.0000 | ORAL_TABLET | Freq: Four times a day (QID) | ORAL | Status: DC | PRN

## 2015-07-04 MED ORDER — PENICILLIN V POTASSIUM 500 MG PO TABS: 500.0000 mg | ORAL_TABLET | Freq: Four times a day (QID) | ORAL | Status: DC

## 2015-07-04 MED ORDER — IBUPROFEN 800 MG PO TABS
800.0000 mg | ORAL_TABLET | Freq: Once | ORAL | Status: AC
  Administered 2015-07-04: 800 mg via ORAL
  Filled 2015-07-04: qty 1

## 2015-07-04 NOTE — Discharge Instructions (Signed)
Read the information below.  Use the prescribed medication as directed.  Please discuss all new medications with your pharmacist.  You may return to the Emergency Department at any time for worsening condition or any new symptoms that concern you.   Please do not take any more tylenol over the next 2 days.  Please call the dentist listed above within 48 hours to schedule a close follow up appointment.  If you develop fevers, swelling in your face, difficulty swallowing or breathing, return to the ER immediately for a recheck.     Dental Abscess A dental abscess is a collection of pus in or around a tooth. CAUSES This condition is caused by a bacterial infection around the root of the tooth that involves the inner part of the tooth (pulp). It may result from:  Severe tooth decay.  Trauma to the tooth that allows bacteria to enter into the pulp, such as a broken or chipped tooth.  Severe gum disease around a tooth. SYMPTOMS Symptoms of this condition include:  Severe pain in and around the infected tooth.  Swelling and redness around the infected tooth, in the mouth, or in the face.  Tenderness.  Pus drainage.  Bad breath.  Bitter taste in the mouth.  Difficulty swallowing.  Difficulty opening the mouth.  Nausea.  Vomiting.  Chills.  Swollen neck glands.  Fever. DIAGNOSIS This condition is diagnosed with examination of the infected tooth. During the exam, your dentist may tap on the infected tooth. Your dentist will also ask about your medical and dental history and may order X-rays. TREATMENT This condition is treated by eliminating the infection. This may be done with:  Antibiotic medicine.  A root canal. This may be performed to save the tooth.  Pulling (extracting) the tooth. This may also involve draining the abscess. This is done if the tooth cannot be saved. HOME CARE INSTRUCTIONS  Take medicines only as directed by your dentist.  If you were prescribed  antibiotic medicine, finish all of it even if you start to feel better.  Rinse your mouth (gargle) often with salt water to relieve pain or swelling.  Do not drive or operate heavy machinery while taking pain medicine.  Do not apply heat to the outside of your mouth.  Keep all follow-up visits as directed by your dentist. This is important. SEEK MEDICAL CARE IF:  Your pain is worse and is not helped by medicine. SEEK IMMEDIATE MEDICAL CARE IF:  You have a fever or chills.  Your symptoms suddenly get worse.  You have a very bad headache.  You have problems breathing or swallowing.  You have trouble opening your mouth.  You have swelling in your neck or around your eye.   This information is not intended to replace advice given to you by your health care provider. Make sure you discuss any questions you have with your health care provider.   Document Released: 08/01/2005 Document Revised: 12/16/2014 Document Reviewed: 07/29/2014 Elsevier Interactive Patient Education 2016 [employer].    Emergency Department Resource Guide 1) Find a Doctor and Pay Out of Pocket Although you won't have to find out who is covered by your insurance plan, it is a good idea to ask around and get recommendations. You will then need to call the office and see if the doctor you have chosen will accept you as a new patient and what types of options they offer for patients who are self-pay. Some doctors offer discounts or will set  up payment plans for their patients who do not have insurance, but you will need to ask so you aren't surprised when you get to your appointment.  2) Contact Your Local Health Department Not all health departments have doctors that can see patients for sick visits, but many do, so it is worth a call to see if yours does. If you don't know where your local health department is, you can check in your phone book. The CDC also has a tool to help you locate your state's health  department, and many state websites also have listings of all of their local health departments.  3) Find a Walk-in Clinic If your illness is not likely to be very severe or complicated, you may want to try a walk in clinic. These are popping up all over the country in pharmacies, drugstores, and shopping centers. They're usually staffed by nurse practitioners or physician assistants that have been trained to treat common illnesses and complaints. They're usually fairly quick and inexpensive. However, if you have serious medical issues or chronic medical problems, these are probably not your best option.  No Primary Care Doctor: - Call Health Connect at  [phone] - they can help you locate a primary care doctor that  accepts your insurance, provides certain services, etc. - Physician Referral Service- [phone]  Chronic Pain Problems: Organization         Address  Phone   Notes  [facility]  [phone] Patients need to be referred by their primary care doctor.   Medication Assistance: Organization         Address  Phone   Notes  [facility] Medication [facility] [address] [place], [place] [address] [phone] --Must be a resident of [facility] -- Must have NO insurance coverage whatsoever (no Medicaid/ Medicare, etc.) -- The pt. MUST have a primary care doctor that directs their care regularly and follows them in the community   MedAssist  [phone]   [employer]  [phone]    Agencies that provide inexpensive medical care: Organization         Address  Phone   Notes  [facility]  [phone]   [facility]    [phone]   [facility] [address] [place], [place] [address] [phone]   [facility] [address] [place] [place] [phone]   Planned Parenthood    [phone]   [facility]    [phone]    [facility] and [facility]  [address], [place] Phone:  [phone], Fax:  [phone] Hours of Operation:  9 am - 6 pm, M-F.  Also accepts Medicaid/Medicare and self-pay.  [facility]  [address], [place] Phone: [phone], Fax: [phone]. Hours of Operation:  8:30 am - 5:30 pm, M-F.  Also accepts Medicaid and self-pay.  [facility] [place] [address] [place] Phone: [phone]   [facility] [address] Natasha Bence [place], [place] [phone], Ext. [phone] Mondays & Thursdays: 7-9 AM.  First 15 patients are seen on a first come, first serve basis.    Medicaid-accepting [facility] Providers:  Organization         Address  Phone   Notes  [facility] [address],  [phone] Also  accepts self-pay patients.  [facility] [address], [place]  [phone]) [phone]   [facility] [address], [place] [phone]) [phone]   [facility] [address] [place] [address], [place] [phone]) [phone]   Renaye RakersVeita Bland [address], [place]   [phone]) [phone] Only accepts [place] Access [place] patients after they have their name applied to their card.   Self-Pay (no insurance) in [facility]:  Organization         Address  Phone   Notes  Sickle Cell Patients, [facility] [address], [place] [phone]) [phone]   [facility] [address], [place] [phone]) [phone]   [facility]  [address], [place] [phone]) [phone]   [facility]/Dr. Osei-Bonsu  [address] [place] [address], [place] or [phone], [place] [phone]) [phone] Phone number for both [place] and [place] locations is the same.  [facility] [address], [place] [phone]) [phone]    [facility] [place] [address] [place] [address], [place] or [address] [phone]) [phone]   [facility] [address], [place] [phone]) [phone], phone; [phone]) [phone], fax Sees patients 1st and 3rd Saturday of every month.  Must not qualify for public or private insurance (i.e. Medicaid, Medicare, [place] Health Choice, Veterans' Benefits)  Household income should be no more than 200% of the poverty level The clinic cannot treat you if you are pregnant or think you are pregnant  Sexually transmitted diseases are not treated at the clinic.    Dental Care: Organization         Address  Phone  Notes  [facility] [address], [place] [phone] Accepts children up to age 321 who are enrolled in [place] or [place] Health Choice; pregnant women with a Medicaid card; and children who have applied for Medicaid or [place] Health Choice, but were declined, whose parents can pay a reduced fee at time of service.  [facility] [place]  [address], [place] [phone] Accepts children up to age 35 who are enrolled in [place] or [place] Health Choice; pregnant women with a Medicaid card; and children who have applied for Medicaid or [place] Health Choice, but were declined, whose parents can pay a reduced fee at time of service.  [facility]  [address], [place] [phone] Patients are seen by appointment only. Walk-ins are not accepted. [facility] will see patients 218 years of age and older. Monday - Tuesday (8am-5pm) Most Wednesdays (8:30-5pm) $30 per visit, cash only  [facility]  [address], [place] [phone] Patients are seen by appointment only. Walk-ins are not accepted. [facility] will see patients 35 years of age and older. One Wednesday Evening (Monthly: Volunteer Based).   $30 per visit, cash only  [facility]  [phone]) [phone] for adults; Children under age 234, call Graduate Pediatric Dentistry at [phone]) [phone]. Children aged 624-14, please call [phone]) [phone] to request a pediatric application.  Dental services are provided in all areas of dental care including fillings, crowns and bridges, complete and partial dentures, implants, gum treatment, root canals, and extractions. Preventive care is also provided. Treatment is  provided to both adults and children. Patients are selected via a lottery and there is often a waiting list.   [facility] [address], [place]  [phone] [email or web address]   [facility] [address] [place], [place] [phone], Ext. [phone] Second and Fourth Thursday of each month, opens at 6:30 AM; Clinic ends at 9 AM.  Patients are seen on a first-come first-served basis, and a limited number are seen during each clinic.   [facility]  [address] Ether Griffins [place], [place] [phone]   Eligibility Requirements You must have lived in [place], [place], or [place] counties for at least the last three months.   You cannot be eligible for state or federal sponsored [place], including [employer], [place], or [employer].   You generally cannot be eligible for healthcare insurance through your employer.    How to apply: Eligibility screenings are held every Tuesday and Wednesday afternoon from 1:00 pm until 4:00 pm. You do not need an appointment for the interview!  [facility] [address], [place], [place] [phone]   [facility] Health Department  [phone]   [facility] Health Department  [phone]   [facility] Health Department  [phone]    Behavioral Health Resources in the Community: Intensive Outpatient Programs Organization         Address  Phone  Notes  [facility] Services [address], [place], [place] [phone]   [facility] Outpatient [address] [place], [place] [phone]   [facility] [address] [place], [place]  [phone]   [facility] Mental Health [address]  [place], [place] [phone] or 4125227244   Substance Abuse Resources Organization         Address  Phone  Notes  [facility]  [phone]   [facility]  [phone]   [facility]  [phone]   Floydene Flock  [phone]   Residential & Outpatient Substance Abuse Program  [phone]   Psychological Services Organization         Address  Phone  Notes  [facility] Behavioral Health  [phone]   [facility] Services  [phone]   [facility] Mental Health [address], [place] [phone] or [phone]    Mobile Crisis Teams Organization         Address  Phone  Notes  [facility], Mobile Crisis Care Unit  [phone]   [facility]  [address] [place], [place] [phone]   Doristine Locks [address] [place] [phone]    Self-Help/Support Groups Organization         Address  Phone             Notes  [facility] [place] - variety of support groups  336- I7437963 Call for more information  Narcotics Anonymous (NA), [facility] [address], [employer] [place]  2 meetings at this location   Statistician         Address  Phone  Notes  [facility] [address],    [place]  [phone]   [facility]  [address], [place] [address], [place], [place] [phone]   [facility] [address] [place], [place] [phone] Admissions: 8am-3pm M-F  [facility] [address],    [place], [place] [phone]  [facility] [address] Manns [place], [place] [phone]   [facility] [address],  [place], [place] [phone]   [facility] - Intensive Outpatient [address], [place], [place] [phone]   [facility] ([facility] [address]  [place], [place] [phone] or [phone]   [facility] ([facility]) [address], [place], [place] [phone] Accepts Medicaid  [facility] [address],  [place] [phone] Substance Abuse/Addiction Treatment   [facility] Organization         Address  Phone  Notes  [facility]  [phone]   Angie Fava, PhD [address] Ervin Knack [place], [place]   [phone] or [phone]   [facility]   [address] [place], [place] [phone]   [facility] [address], [place], [place] [phone] Insurance/Medicaid/sponsorship through [facility] [address]                                    [place], [place] [phone] Therapy/tele-psych/case  [facility] [address], [place] [phone]    Dr. Lolly Mustache  [phone]   [facility]  [facility] Dept. 1) [address], [place] 2) [address] [place] 3)  [address], [place] [phone]  [phone]   [facility] Child Abuse Hotline 605-006-6007 or [phone] (After Hours)

## 2015-07-04 NOTE — ED Provider Notes (Signed)
CSN: [id number]     Arrival date & time 07/04/15  1329 History  By signing my name below, I, Jarvis Morganaylor Ferguson, attest that this documentation has been prepared under the direction and in the presence of Trixie DredgeEmily Ebrahim Deremer, PA-C Electronically Signed: Jarvis Morganaylor Ferguson, ED Scribe. 07/04/2015. 1:53 PM.    Chief Complaint  Patient presents with  . Dental Pain    r/lower jaw    The history is provided by the patient. No language interpreter was used.    HPI Comments: [name] is a 34 y.o. female who presents to the Emergency Department complaining of intermittent, gradually worsening, right lower dental pain onset 2 months that has begun to gradually worsen over the past 4 days.  She reports associated swelling to her right jaw. Pt notes she has been taking 500mg  extra strength Tylenol and has taken 5 doses, 10 tablets, within the past 10 hours with no significant relief; her last dose was at 10 AM. She states she took a single dose Wednesday night and a single dose of Naproxen Thursday night. Pt reports she is worried there is an abscess to the tooth. She denies any sore throat, trouble swallowing, SOB, fever, chills, nausea, vomiting.   Past Medical History  Diagnosis Date  . BV (bacterial vaginosis)   . Tinea corporis   . Chlamydia   . Pain, dental    Past Surgical History  Procedure Laterality Date  . Tubal ligation     Family History  Problem Relation Age of Onset  . Stroke Other   . Diabetes Other   . Hypertension Other    Social History  Substance Use Topics  . Smoking status: Never Smoker   . Smokeless tobacco: None  . Alcohol Use: Yes     Comment: socially   OB History    No data available     Review of Systems  Constitutional: Negative for fever and chills.  HENT: Positive for dental problem and facial swelling. Negative for sore throat and trouble swallowing.   Respiratory: Negative for shortness of breath.   Gastrointestinal: Negative for nausea and vomiting.   Musculoskeletal: Negative for neck pain and neck stiffness.  Skin: Negative for color change and rash.  Allergic/Immunologic: Negative for immunocompromised state.  Hematological: Does not bruise/bleed easily.  Psychiatric/Behavioral: Negative for self-injury.      Allergies  Review of patient's allergies indicates no known allergies.  Home Medications   Prior to Admission medications   Medication Sig Start Date End Date Taking? Authorizing Provider  acetaminophen (TYLENOL) 500 MG tablet Take 1,500 mg by mouth every 6 (six) hours as needed for mild pain, moderate pain or headache.    Historical Provider, MD  amoxicillin (AMOXIL) 500 MG capsule Take 1 capsule (500 mg total) by mouth 3 (three) times daily. Patient not taking: Reported on 05/12/2015 11/11/14   Nada Boozerobyn M Hess, PA-C  ondansetron (ZOFRAN ODT) 4 MG disintegrating tablet Take 1 tablet (4 mg total) by mouth every 6 (six) hours as needed for nausea. Patient not taking: Reported on 07/04/2014 11/01/12   Raeford RazorStephen Kohut, MD  traMADol (ULTRAM) 50 MG tablet Take 1 tablet (50 mg total) by mouth every 6 (six) hours as needed. 05/12/15   Catha GosselinHanna Patel-Mills, PA-C   Triage Vitals: BP 124/72 mmHg  Pulse 77  Temp(Src) 98.5 F (36.9 C) (Oral)  Resp 18  SpO2 98%  LMP 06/15/2015 (Exact Date)  Physical Exam  Constitutional: She appears well-developed and well-nourished. No distress.  HENT:  Head:  Normocephalic and atraumatic.  Mouth/Throat: Uvula is midline and oropharynx is clear and moist. Mucous membranes are not dry. No uvula swelling. No oropharyngeal exudate, posterior oropharyngeal edema, posterior oropharyngeal erythema or tonsillar abscesses.  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 Neck supple. No tracheal tenderness present. No rigidity. No tracheal deviation, no edema, no erythema and normal range of motion present.   No paratracheal tenderness or fullness of the neck  Cardiovascular: Normal rate.   Pulmonary/Chest: Effort normal and breath sounds normal. No stridor.  Lymphadenopathy:    She has no cervical adenopathy.  Neurological: She is alert.  Skin: She is not diaphoretic.  Psychiatric: She has a normal mood and affect. Her behavior is normal.  Nursing note and vitals reviewed.   ED Course  Procedures (including critical care time)  DIAGNOSTIC STUDIES: Oxygen Saturation is 98% on RA, normal by my interpretation.    COORDINATION OF CARE:  2:19 PM-Will order CMP and acetaminophen level.  Pt advised of plan for treatment and pt agrees.  3:26 PM- Will order Ibuprofen . Pt advised of plan for treatment and pt agrees.   Labs Review Labs Reviewed  COMPREHENSIVE METABOLIC PANEL - Abnormal; Notable for the following:    ALT 9 (*)    All other components within normal limits  ACETAMINOPHEN LEVEL    Imaging Review No results found. I have personally reviewed and evaluated these lab results as part of my medical decision-making.   EKG Interpretation None       2:15 PM Discussed possible accidental tylenol overdose with Dr Adriana Simas.  Have ordered tylenol level and CMP.    MDM   Final diagnoses:  Dental abscess    Afebrile, nontoxic patient with new dental pain and obvious abscess. Doubt Ludwig's angina.  Pt also with unintentional overdose of tylenol, 5g in 10 hours.  Discussed this with Dr Adriana Simas who recommended testing without treatment.  Testing reassuring, tylenol level 13, LFTs normal.  No need for further testing or treatment for tylenol.  This was not intentional, was intended to treat pain.  D/C home with antibiotic, pain medication and dental follow up.  Discussed findings, treatment, and follow up  with patient.  Pt given return precautions.  Pt verbalizes understanding and agrees with plan.       I personally performed the services described in this documentation, which was  scribed in my presence. The recorded information has been reviewed and is accurate.     Trixie Dredge, PA-C 07/04/15 1638  Donnetta Hutching, MD 07/05/15 [phone]

## 2015-07-04 NOTE — ED Notes (Signed)
Pt reports pain and swelling in r/lower jaw x 4 days

## 2016-04-06 ENCOUNTER — Emergency Department (HOSPITAL_COMMUNITY)
Admission: EM | Admit: 2016-04-06 | Discharge: 2016-04-06 | Discharge status: Against Medical Advice | Payer: Medicaid Other

## 2016-04-06 ENCOUNTER — Emergency Department (HOSPITAL_COMMUNITY)
Admission: EM | Admit: 2016-04-06 | Discharge: 2016-04-06 | Disposition: A | Discharge status: Home / Self Care | Payer: No Typology Code available for payment source | Attending: Dermatology | Admitting: Dermatology

## 2016-04-06 ENCOUNTER — Encounter (HOSPITAL_COMMUNITY): Payer: Self-pay | Admitting: Emergency Medicine

## 2016-04-06 DIAGNOSIS — Z5321 Procedure and treatment not carried out due to patient leaving prior to being seen by health care provider: Secondary | ICD-10-CM | POA: Insufficient documentation

## 2016-04-06 DIAGNOSIS — Y92410 Unspecified street and highway as the place of occurrence of the external cause: Secondary | ICD-10-CM | POA: Diagnosis not present

## 2016-04-06 DIAGNOSIS — Y9389 Activity, other specified: Secondary | ICD-10-CM | POA: Insufficient documentation

## 2016-04-06 DIAGNOSIS — M545 Low back pain: Secondary | ICD-10-CM | POA: Diagnosis not present

## 2016-04-06 DIAGNOSIS — Y999 Unspecified external cause status: Secondary | ICD-10-CM | POA: Diagnosis not present

## 2016-04-06 NOTE — ED Notes (Addendum)
Pt returned labels and armband to Registration and reported that she was leaving.

## 2016-04-06 NOTE — ED Notes (Signed)
Pt told registration he was leaving 
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
## 2017-01-24 ENCOUNTER — Emergency Department: Payer: Self-pay

## 2017-01-24 ENCOUNTER — Encounter: Payer: Self-pay | Admitting: Emergency Medicine

## 2017-01-24 ENCOUNTER — Emergency Department
Admission: EM | Admit: 2017-01-24 | Discharge: 2017-01-24 | Disposition: A | Discharge status: Home / Self Care | Payer: Self-pay | Attending: Emergency Medicine | Admitting: Emergency Medicine

## 2017-01-24 DIAGNOSIS — R112 Nausea with vomiting, unspecified: Secondary | ICD-10-CM | POA: Insufficient documentation

## 2017-01-24 DIAGNOSIS — K529 Noninfective gastroenteritis and colitis, unspecified: Secondary | ICD-10-CM | POA: Insufficient documentation

## 2017-01-24 LAB — LIPASE, BLOOD: LIPASE: 36 U/L (ref 11–51)

## 2017-01-24 LAB — URINALYSIS, COMPLETE (UACMP) WITH MICROSCOPIC
Bacteria, UA: NONE SEEN
Bilirubin Urine: NEGATIVE
GLUCOSE, UA: NEGATIVE mg/dL
Hgb urine dipstick: NEGATIVE
KETONES UR: NEGATIVE mg/dL
Leukocytes, UA: NEGATIVE
Nitrite: NEGATIVE
PROTEIN: NEGATIVE mg/dL
Specific Gravity, Urine: 1.019 (ref 1.005–1.030)
pH: 7 (ref 5.0–8.0)

## 2017-01-24 LAB — COMPREHENSIVE METABOLIC PANEL
ALBUMIN: 4 g/dL (ref 3.5–5.0)
ALK PHOS: 44 U/L (ref 38–126)
ALT: 10 U/L — AB (ref 14–54)
ANION GAP: 6 (ref 5–15)
AST: 20 U/L (ref 15–41)
BUN: 11 mg/dL (ref 6–20)
CALCIUM: 8.9 mg/dL (ref 8.9–10.3)
CO2: 24 mmol/L (ref 22–32)
CREATININE: 0.89 mg/dL (ref 0.44–1.00)
Chloride: 106 mmol/L (ref 101–111)
GFR calc Af Amer: >60 mL/min (ref >60)
GFR calc non Af Amer: >60 mL/min (ref >60)
GLUCOSE: 89 mg/dL (ref 65–99)
Potassium: 3.4 mmol/L — ABNORMAL LOW (ref 3.5–5.1)
SODIUM: 136 mmol/L (ref 135–145)
Total Bilirubin: 0.8 mg/dL (ref 0.3–1.2)
Total Protein: 7.9 g/dL (ref 6.5–8.1)

## 2017-01-24 LAB — CBC
HCT: 24.7 % — ABNORMAL LOW (ref 35.0–47.0)
HEMOGLOBIN: 7.5 g/dL — AB (ref 12.0–16.0)
MCH: 18.6 pg — AB (ref 26.0–34.0)
MCHC: 30.3 g/dL — ABNORMAL LOW (ref 32.0–36.0)
MCV: 61.3 fL — ABNORMAL LOW (ref 80.0–100.0)
Platelets: 276 10*3/uL (ref 150–440)
RBC: 4.02 MIL/uL (ref 3.80–5.20)
RDW: 16.8 % — ABNORMAL HIGH (ref 11.5–14.5)
WBC: 4.3 10*3/uL (ref 3.6–11.0)

## 2017-01-24 LAB — POCT PREGNANCY, URINE: PREG TEST UR: NEGATIVE

## 2017-01-24 MED ORDER — SODIUM CHLORIDE 0.9 % IV BOLUS (SEPSIS)
1000.0000 mL | INTRAVENOUS | Status: AC
  Administered 2017-01-24: 1000 mL via INTRAVENOUS

## 2017-01-24 MED ORDER — HYDROCODONE-ACETAMINOPHEN 5-325 MG PO TABS: 1.0000 | ORAL_TABLET | ORAL | 0 refills | Status: DC | PRN

## 2017-01-24 MED ORDER — ONDANSETRON 4 MG PO TBDP: ORAL_TABLET | ORAL | 0 refills | Status: DC

## 2017-01-24 MED ORDER — MORPHINE SULFATE (PF) 4 MG/ML IV SOLN
4.0000 mg | Freq: Once | INTRAVENOUS | Status: AC
  Administered 2017-01-24: 4 mg via INTRAVENOUS
  Filled 2017-01-24: qty 1

## 2017-01-24 MED ORDER — ONDANSETRON HCL 4 MG/2ML IJ SOLN
4.0000 mg | INTRAMUSCULAR | Status: AC
  Administered 2017-01-24: 4 mg via INTRAVENOUS
  Filled 2017-01-24: qty 2

## 2017-01-24 NOTE — ED Notes (Signed)
Pt verbalizes understanding of discharge instructions.

## 2017-01-24 NOTE — ED Triage Notes (Signed)
Pt toe d with c/o abd pain and cramping and n/v/d that started this am around 930 am.  Pt states she ate chinese food late last night and then when she awoke immediately had 1 episode of vomiting with nausea since.  States diarrhea today x 7 episodes.

## 2017-01-24 NOTE — ED Notes (Signed)
Patient transported to Ultrasound 

## 2017-01-24 NOTE — Discharge Instructions (Signed)

## 2017-01-24 NOTE — ED Provider Notes (Signed)
[facility] Emergency Department Provider Note  ____________________________________________   First MD Initiated Contact with Patient 01/24/17 1608     (approximate)  I have reviewed the triage vital signs and the nursing notes.   HISTORY  Chief Complaint Abdominal Pain; Nausea; and Diarrhea    HPI [name] is a 36 y.o. female with no significant past medical history who presents for evaluation of acute onset abdominal pain, nausea, vomiting, and diarrhea that started about 7.5 hours ago.  She states that she ate a meal of [place] food last night at about 9:00 PM.  When she awoke this morning at around 9 AM she felt ill and describes upper abdominal pain that is sharp and aching and nonradiating.  It is severe and accompanied with 2 episodes of vomiting.  She has had 7 episodes of diarrhea today and has not been able to eat or drink anything.  Attempting to eat or drink anything makes her symptoms worsen nothing makes it better.  She has not taken any medication.  She denies fever/chills, chest pain, shortness of breath, dysuria   Past Medical History:  Diagnosis Date  . BV (bacterial vaginosis)   . Chlamydia   . Pain, dental   . Tinea corporis     There are no active problems to display for this patient.   Past Surgical History:  Procedure Laterality Date  . TUBAL LIGATION      Prior to Admission medications   Medication Sig Start Date End Date Taking? Authorizing Provider  HYDROcodone-acetaminophen (NORCO/VICODIN) 5-325 MG tablet Take 1-2 tablets by mouth every 4 (four) hours as needed for moderate pain. 01/24/17   Loleta Rose, MD  HYDROcodone-ibuprofen (VICOPROFEN) 7.5-200 MG tablet Take 1 tablet by mouth every 6 (six) hours as needed for moderate pain or severe pain. 07/04/15   Trixie Dredge, PA-C  ondansetron (ZOFRAN ODT) 4 MG disintegrating tablet Allow 1-2 tablets to dissolve in your mouth every 8 hours as needed for nausea/vomiting  01/24/17   Loleta Rose, MD  penicillin v potassium (VEETID) 500 MG tablet Take 1 tablet (500 mg total) by mouth 4 (four) times daily. 07/04/15   Trixie Dredge, PA-C    Allergies Patient has no known allergies.  Family History  Problem Relation Age of Onset  . Stroke Other   . Diabetes Other   . Hypertension Other     Social History Social History  Substance Use Topics  . Smoking status: Never Smoker  . Smokeless tobacco: Never Used  . Alcohol use Yes     Comment: socially    Review of Systems Constitutional: No fever/chills Eyes: No visual changes. ENT: No sore throat. Cardiovascular: Denies chest pain. Respiratory: Denies shortness of breath. Gastrointestinal: Acute onset upper abdominal pain with nausea, vomiting 2, diarrhea 7 Genitourinary: Negative for dysuria. Musculoskeletal: Negative for neck pain.  Negative for back pain. Integumentary: Negative for rash. Neurological: Negative for headaches, focal weakness or numbness.   ____________________________________________   PHYSICAL EXAM:  VITAL SIGNS: ED Triage Vitals  Enc Vitals Group     BP 01/24/17 1458 (!) 142/78     Pulse Rate 01/24/17 1458 71     Resp 01/24/17 1458 16     Temp 01/24/17 1458 98.6 F (37 C)     Temp Source 01/24/17 1458 Oral     SpO2 01/24/17 1458 100 %     Weight 01/24/17 1458 83 kg (183 lb)     Height --  Head Circumference --      Peak Flow --      Pain Score 01/24/17 1457 6     Pain Loc --      Pain Edu? --      Excl. in GC? --     Constitutional: Alert and oriented. Generally well-appearing but does appear uncomfortable Eyes: Conjunctivae are normal.  Head: Atraumatic. Nose: No congestion/rhinnorhea. Mouth/Throat: Mucous membranes are moist. Neck: No stridor.  No meningeal signs.   Cardiovascular: Normal rate, regular rhythm. Good peripheral circulation. Grossly normal heart sounds. Respiratory: Normal respiratory effort.  No retractions. Lungs  CTAB. Gastrointestinal: Soft with no lower abdominal tenderness but moderate severe tenderness of the epigastrium and right upper quadrant.  Positive Murphy sign.  Of note, when I palpated her right upper quadrant she guarded and started gagging and had a small amount of emesis.  No tenderness to palpation at McBurney's point Musculoskeletal: No lower extremity tenderness nor edema. No gross deformities of extremities. Neurologic:  Normal speech and language. No gross focal neurologic deficits are appreciated.  Skin:  Skin is warm, dry and intact. No rash noted. Psychiatric: Mood and affect are normal. Speech and behavior are normal.  ____________________________________________   LABS (all labs ordered are listed, but only abnormal results are displayed)  Labs Reviewed  COMPREHENSIVE METABOLIC PANEL - Abnormal; Notable for the following:       Result Value   Potassium 3.4 (*)    ALT 10 (*)    All other components within normal limits  CBC - Abnormal; Notable for the following:    Hemoglobin 7.5 (*)    HCT 24.7 (*)    MCV 61.3 (*)    MCH 18.6 (*)    MCHC 30.3 (*)    RDW 16.8 (*)    All other components within normal limits  URINALYSIS, COMPLETE (UACMP) WITH MICROSCOPIC - Abnormal; Notable for the following:    Color, Urine YELLOW (*)    APPearance HAZY (*)    Squamous Epithelial / LPF 6-30 (*)    All other components within normal limits  LIPASE, BLOOD  POCT PREGNANCY, URINE  POC URINE PREG, ED   ____________________________________________  EKG  None - EKG not ordered by ED physician ____________________________________________  RADIOLOGY   US Abdomen Limited Ruq  Result Date: 01/24/2017 CLINICAL DATA:  Epigastric and right upper quadrant abdominal pain with nausea, vomiting and diarrhea since this morning. EXAM: ULTRASOUND ABDOMEN LIMITED RIGHT UPPER QUADRANT COMPARISON:  None. FINDINGS: Gallbladder: No gallstones or wall thickening visualized. No sonographic Murphy  sign noted by sonographer. Common bile duct: Diameter: 3.4 mm air Liver: No focal lesion identified. Within normal limits in parenchymal echogenicity. Trace amount of fluid noted along the inferior aspect of the right hepatic lobe. IMPRESSION: Normal gallbladder and normal caliber common bile duct. Normal liver. Trace amount of fluid noted along the inferior aspect of the right hepatic lobe. Electronically Signed   By: Rudie Meyer M.D.   On: 01/24/2017 17:00    ____________________________________________   PROCEDURES  Critical Care performed: No   Procedure(s) performed:   Procedures   ____________________________________________   INITIAL IMPRESSION / ASSESSMENT AND PLAN / ED COURSE  Pertinent labs & imaging results that were available during my care of the patient were reviewed by me and considered in my medical decision making (see chart for details).  Food poisoning versus viral gastroenteritis is very probable, but she does have marked respiratory palpation of the epigastrium and right upper quadrant with positive  Murphy sign.  This may be related to the gastroenteritis in general but I think it is worth ruling out the possibility of gallbladder disease with an ultrasound.  Given her discomfort and lack of by mouth intake and emesis and diarrhea, we will place an IV, give her 1 L of fluid, give 4 mg of morphine, and 4 mg of IV Zofran.  She is comfortable with the current plan.   Clinical Course as of Jan 24 1899  Tue Jan 24, 2017  1803 the patient is feeling much better at this time.  I updated her about the negative ultrasound results.  She has had no more vomiting or diarrhea.  [CF]  1900 Patient states she feels much better and is ready to go home.  I gave my usual and customary return precautions.     [CF]    Clinical Course User Index [CF] Loleta RoseForbach, Alfonsa Vaile, MD    ____________________________________________  FINAL CLINICAL IMPRESSION(S) / ED DIAGNOSES  Final  diagnoses:  Gastroenteritis     MEDICATIONS GIVEN DURING THIS VISIT:  Medications  morphine 4 MG/ML injection 4 mg (4 mg Intravenous Given 01/24/17 1630)  ondansetron (ZOFRAN) injection 4 mg (4 mg Intravenous Given 01/24/17 1630)  sodium chloride 0.9 % bolus 1,000 mL (1,000 mLs Intravenous New Bag/Given 01/24/17 1634)     NEW OUTPATIENT MEDICATIONS STARTED DURING THIS VISIT:  New Prescriptions   HYDROCODONE-ACETAMINOPHEN (NORCO/VICODIN) 5-325 MG TABLET    Take 1-2 tablets by mouth every 4 (four) hours as needed for moderate pain.   ONDANSETRON (ZOFRAN ODT) 4 MG DISINTEGRATING TABLET    Allow 1-2 tablets to dissolve in your mouth every 8 hours as needed for nausea/vomiting    Modified Medications   No medications on file    Discontinued Medications   No medications on file     Note:  This document was prepared using Dragon voice recognition software and may include unintentional dictation errors.    Loleta RoseForbach, Essynce Munsch, MD 01/24/17 1900

## 2018-05-02 ENCOUNTER — Emergency Department (HOSPITAL_COMMUNITY)
Admission: EM | Admit: 2018-05-02 | Discharge: 2018-05-02 | Disposition: A | Discharge status: Home / Self Care | Payer: Self-pay | Attending: Emergency Medicine | Admitting: Emergency Medicine

## 2018-05-02 ENCOUNTER — Other Ambulatory Visit: Payer: Self-pay

## 2018-05-02 ENCOUNTER — Emergency Department (HOSPITAL_COMMUNITY): Payer: Self-pay

## 2018-05-02 ENCOUNTER — Encounter (HOSPITAL_COMMUNITY): Payer: Self-pay

## 2018-05-02 DIAGNOSIS — R110 Nausea: Secondary | ICD-10-CM | POA: Insufficient documentation

## 2018-05-02 DIAGNOSIS — R197 Diarrhea, unspecified: Secondary | ICD-10-CM | POA: Insufficient documentation

## 2018-05-02 DIAGNOSIS — N760 Acute vaginitis: Secondary | ICD-10-CM | POA: Insufficient documentation

## 2018-05-02 DIAGNOSIS — B9689 Other specified bacterial agents as the cause of diseases classified elsewhere: Secondary | ICD-10-CM | POA: Insufficient documentation

## 2018-05-02 DIAGNOSIS — R1032 Left lower quadrant pain: Secondary | ICD-10-CM | POA: Insufficient documentation

## 2018-05-02 LAB — CBC WITH DIFFERENTIAL/PLATELET
BASOS PCT: 1 %
Basophils Absolute: 0 10*3/uL (ref 0.0–0.1)
EOS ABS: 0.1 10*3/uL (ref 0.0–0.7)
EOS PCT: 2 %
HCT: 28.4 % — ABNORMAL LOW (ref 36.0–46.0)
Hemoglobin: 8.3 g/dL — ABNORMAL LOW (ref 12.0–15.0)
Lymphocytes Relative: 37 %
Lymphs Abs: 1.9 10*3/uL (ref 0.7–4.0)
MCH: 20.6 pg — AB (ref 26.0–34.0)
MCHC: 29.2 g/dL — ABNORMAL LOW (ref 30.0–36.0)
MCV: 70.6 fL — ABNORMAL LOW (ref 78.0–100.0)
MONOS PCT: 9 %
Monocytes Absolute: 0.5 10*3/uL (ref 0.1–1.0)
Neutro Abs: 2.6 10*3/uL (ref 1.7–7.7)
Neutrophils Relative %: 51 %
PLATELETS: 484 10*3/uL — AB (ref 150–400)
RBC: 4.02 MIL/uL (ref 3.87–5.11)
RDW: 16.2 % — AB (ref 11.5–15.5)
WBC: 5.1 10*3/uL (ref 4.0–10.5)

## 2018-05-02 LAB — COMPREHENSIVE METABOLIC PANEL
ALK PHOS: 45 U/L (ref 38–126)
ALT: 10 U/L (ref 0–44)
ANION GAP: 9 (ref 5–15)
AST: 19 U/L (ref 15–41)
Albumin: 4.3 g/dL (ref 3.5–5.0)
BILIRUBIN TOTAL: 0.4 mg/dL (ref 0.3–1.2)
BUN: 15 mg/dL (ref 6–20)
CALCIUM: 9.7 mg/dL (ref 8.9–10.3)
CO2: 27 mmol/L (ref 22–32)
Chloride: 106 mmol/L (ref 98–111)
Creatinine, Ser: 0.92 mg/dL (ref 0.44–1.00)
GFR calc Af Amer: >60 mL/min (ref >60)
Glucose, Bld: 91 mg/dL (ref 70–99)
POTASSIUM: 3.4 mmol/L — AB (ref 3.5–5.1)
Sodium: 142 mmol/L (ref 135–145)
TOTAL PROTEIN: 8.4 g/dL — AB (ref 6.5–8.1)

## 2018-05-02 LAB — URINALYSIS, ROUTINE W REFLEX MICROSCOPIC
Bilirubin Urine: NEGATIVE
Glucose, UA: NEGATIVE mg/dL
Hgb urine dipstick: NEGATIVE
KETONES UR: NEGATIVE mg/dL
Nitrite: NEGATIVE
PROTEIN: NEGATIVE mg/dL
Specific Gravity, Urine: 1.027 (ref 1.005–1.030)
pH: 6 (ref 5.0–8.0)

## 2018-05-02 LAB — WET PREP, GENITAL
Sperm: NONE SEEN
Trich, Wet Prep: NONE SEEN
YEAST WET PREP: NONE SEEN

## 2018-05-02 LAB — PREGNANCY, URINE: Preg Test, Ur: NEGATIVE

## 2018-05-02 MED ORDER — IOPAMIDOL (ISOVUE-300) INJECTION 61%
INTRAVENOUS | Status: AC
  Filled 2018-05-02: qty 100

## 2018-05-02 MED ORDER — METRONIDAZOLE 500 MG PO TABS: 500.0000 mg | ORAL_TABLET | Freq: Two times a day (BID) | ORAL | 0 refills | Status: AC

## 2018-05-02 MED ORDER — CEFTRIAXONE SODIUM 250 MG IJ SOLR
250.0000 mg | Freq: Once | INTRAMUSCULAR | Status: AC
  Administered 2018-05-02: 250 mg via INTRAMUSCULAR
  Filled 2018-05-02: qty 250

## 2018-05-02 MED ORDER — SODIUM CHLORIDE 0.9 % IV BOLUS
1000.0000 mL | Freq: Once | INTRAVENOUS | Status: AC
  Administered 2018-05-02: 1000 mL via INTRAVENOUS

## 2018-05-02 MED ORDER — IOPAMIDOL (ISOVUE-300) INJECTION 61%
100.0000 mL | Freq: Once | INTRAVENOUS | Status: AC | PRN
  Administered 2018-05-02: 100 mL via INTRAVENOUS

## 2018-05-02 MED ORDER — AZITHROMYCIN 250 MG PO TABS
1000.0000 mg | ORAL_TABLET | Freq: Once | ORAL | Status: AC
  Administered 2018-05-02: 1000 mg via ORAL
  Filled 2018-05-02: qty 4

## 2018-05-02 MED ORDER — LIDOCAINE HCL (PF) 1 % IJ SOLN
2.0000 mL | Freq: Once | INTRAMUSCULAR | Status: AC
  Administered 2018-05-02: 2 mL
  Filled 2018-05-02: qty 30

## 2018-05-02 NOTE — Discharge Instructions (Addendum)
Evaluated today for left lower quadrant pain STD testing.  You are anemic on your lab results.  Please follow-up with your primary care provider for follow-up with you low hemoglobin.    Your CT scan was negative.    You do have gonorrhea and chlamydia testing will not result for 24 to 48 hours.  You will be called if results are positive.  If your results are positive you will need to have all your partners tested.  You have been prescribed treated with azithromycin and Rocephin for this. Please refrain from sexual intercourse for 1 week after you and your partner have been treated.    Your wet prep did result with bacterial vaginosis.  You have been prescribed Flagyl.  Please take as prescribed.  Please not drink alcohol with taking this medicine.    Please return to the ED if any new or worsening symptoms such as:  Get help right away if: Your pain does not go away as soon as your doctor says it should. You cannot stop throwing up. Your pain is only in areas of your belly, such as the right side or the left lower part of the belly. You have bloody or black poop, or poop that looks like tar. You have very bad pain, cramping, or bloating in your belly. You have signs of not having enough fluid or water in your body (dehydration), such as: Dark pee, very little pee, or no pee. Cracked lips. Dry mouth. Sunken eyes. Sleepiness.

## 2018-05-02 NOTE — ED Triage Notes (Signed)
Pt states that for approx 1 week, she has been having abd pain and "slightly" foul smelling vaginal discharge. Pt states pain is the worst in the left groin area. Pt states she has had a new partner since June, and has not been tested since getting together.

## 2018-05-02 NOTE — ED Provider Notes (Signed)
[facility]   CSN: [id number] Arrival date & time: 05/02/18  1619   History   Chief Complaint Chief Complaint  Patient presents with  . Abdominal Pain  . Vaginal Discharge    HPI [name] is a 37 y.o. female past medical history significant for BV, chlamydia, chronic anemia presents for evaluation of left lower quadrant pain.  Patient states his pain started approximately 1 week ago.  Pain is been constant in nature. Rates her pain a 4/10. Denies aggravating or alleviating factors. Pain does not radiate. Admits to a malodorous, brown vaginal discharge. Denies fever, chills, SOB, dysuria, hematuria ,constipation.  Admits to history of PID.  HPI  Past Medical History:  Diagnosis Date  . BV (bacterial vaginosis)   . Chlamydia   . Pain, dental   . Tinea corporis     There are no active problems to display for this patient.   Past Surgical History:  Procedure Laterality Date  . TUBAL LIGATION       OB History   None      Home Medications    Prior to Admission medications   Medication Sig Start Date End Date Taking? Authorizing Provider  HYDROcodone-acetaminophen (NORCO/VICODIN) 5-325 MG tablet Take 1-2 tablets by mouth every 4 (four) hours as needed for moderate pain. Patient not taking: Reported on 05/02/2018 01/24/17   Loleta RoseForbach, Cory, MD  HYDROcodone-ibuprofen (VICOPROFEN) 7.5-200 MG tablet Take 1 tablet by mouth every 6 (six) hours as needed for moderate pain or severe pain. Patient not taking: Reported on 05/02/2018 07/04/15   Trixie DredgeWest, Emily, PA-C  metroNIDAZOLE (FLAGYL) 500 MG tablet Take 1 tablet (500 mg total) by mouth 2 (two) times daily for 7 days. 05/02/18 05/09/18  Acelyn Basham A, PA-C  ondansetron (ZOFRAN ODT) 4 MG disintegrating tablet Allow 1-2 tablets to dissolve in your mouth every 8 hours as needed for nausea/vomiting Patient not taking: Reported on 05/02/2018 01/24/17   Loleta RoseForbach, Cory, MD  penicillin  v potassium (VEETID) 500 MG tablet Take 1 tablet (500 mg total) by mouth 4 (four) times daily. Patient not taking: Reported on 05/02/2018 07/04/15   Trixie DredgeWest, Emily, PA-C    Family History Family History  Problem Relation Age of Onset  . Stroke Other   . Diabetes Other   . Hypertension Other     Social History Social History   Tobacco Use  . Smoking status: Never Smoker  . Smokeless tobacco: Never Used  Substance Use Topics  . Alcohol use: Not Currently    Comment: socially  . Drug use: No     Allergies   Patient has no known allergies.   Review of Systems Review of Systems  Constitutional: Negative for activity change, appetite change, chills, diaphoresis, fatigue, fever and unexpected weight change.  Respiratory: Negative.   Cardiovascular: Negative.   Gastrointestinal: Positive for abdominal pain, diarrhea and nausea. Negative for abdominal distention, blood in stool, constipation and vomiting.  Genitourinary: Positive for vaginal discharge. Negative for decreased urine volume, difficulty urinating, dysuria, flank pain, frequency, genital sores, hematuria, menstrual problem, pelvic pain, urgency, vaginal bleeding and vaginal pain.  Musculoskeletal: Negative for back pain.  Skin: Negative.      Physical Exam Updated Vital Signs BP (!) 141/78 (BP Location: Left Arm)   Pulse 67   Temp 98.3 F (36.8 C) (Oral)   Resp 18   Ht 5\' 6"  (1.676 m)   Wt 79.4 kg   LMP 04/22/2018   SpO2 100%  BMI 28.25 kg/m   Physical Exam  Constitutional: She appears well-developed and well-nourished.  Non-toxic appearance. She does not appear ill. No distress.  HENT:  Head: Atraumatic.  Eyes: Pupils are equal, round, and reactive to light.  Neck: Normal range of motion. Neck supple.  Cardiovascular: Normal rate and regular rhythm.  Pulmonary/Chest: Effort normal. No respiratory distress.  Abdominal: Soft. Normal appearance and bowel sounds are normal. She exhibits no shifting  dullness, no distension, no pulsatile liver, no fluid wave, no ascites, no pulsatile midline mass and no mass. There is no hepatosplenomegaly. There is tenderness in the left lower quadrant. There is no rigidity, no rebound, no guarding, no CVA tenderness, no tenderness at McBurney's point and negative Murphy's sign. No hernia.  Genitourinary:  Genitourinary Comments: Normal appearing external female genitalia without rashes or lesions, normal vaginal epithelium. Normal appearing cervix without petechiae. Cervical os is closed. There is no bleeding noted at the os. Malodorous vaginal discharge. Bimanual: No CMT.  No palpable adnexal masses or tenderness. Uterus midline and not fixed. No cystocele or rectocele noted. No pelvic lymphadenopathy noted. Wet prep was obtained.  Cultures for gonorrhea and chlamydia collected. Exam performed with chaperone in room.  Musculoskeletal: Normal range of motion.  Neurological: She is alert.  Skin: Skin is warm and dry. She is not diaphoretic.  Psychiatric: She has a normal mood and affect.  Nursing note and vitals reviewed.    ED Treatments / Results  Labs (all labs ordered are listed, but only abnormal results are displayed) Labs Reviewed  WET PREP, GENITAL - Abnormal; Notable for the following components:      Result Value   Clue Cells Wet Prep HPF POC PRESENT (*)    WBC, Wet Prep HPF POC FEW (*)    All other components within normal limits  URINALYSIS, ROUTINE W REFLEX MICROSCOPIC - Abnormal; Notable for the following components:   APPearance HAZY (*)    Leukocytes, UA SMALL (*)    Bacteria, UA RARE (*)    All other components within normal limits  CBC WITH DIFFERENTIAL/PLATELET - Abnormal; Notable for the following components:   Hemoglobin 8.3 (*)    HCT 28.4 (*)    MCV 70.6 (*)    MCH 20.6 (*)    MCHC 29.2 (*)    RDW 16.2 (*)    Platelets 484 (*)    All other components within normal limits  COMPREHENSIVE METABOLIC PANEL - Abnormal; Notable  for the following components:   Potassium 3.4 (*)    Total Protein 8.4 (*)    All other components within normal limits  URINE CULTURE  PREGNANCY, URINE  HIV ANTIBODY (ROUTINE TESTING W REFLEX)  RPR  GC/CHLAMYDIA PROBE AMP ([place]) NOT AT [facility]    EKG None  Radiology Ct Abdomen Pelvis W Contrast  Result Date: 05/02/2018 CLINICAL DATA:  Abdominal pain, not otherwise specified EXAM: CT ABDOMEN AND PELVIS WITH CONTRAST TECHNIQUE: Multidetector CT imaging of the abdomen and pelvis was performed using the standard protocol following bolus administration of intravenous contrast. CONTRAST:  ISOVUE-300 IOPAMIDOL (ISOVUE-300) INJECTION 61% COMPARISON:  Ultrasound abdomen 01/24/2017 FINDINGS: Lower chest: The lung bases are clear. Hepatobiliary: No focal liver abnormality is seen. No gallstones, gallbladder wall thickening, or biliary dilatation. Pancreas: Unremarkable. No pancreatic ductal dilatation or surrounding inflammatory changes. Spleen: Normal in size without focal abnormality. Adrenals/Urinary Tract: Adrenal glands are unremarkable. Kidneys are normal, without renal calculi, focal lesion, or hydronephrosis. Bladder is unremarkable. Stomach/Bowel: Stomach is within normal  limits. Appendix appears normal. No evidence of bowel wall thickening, distention, or inflammatory changes. Vascular/Lymphatic: Normal caliber abdominal aorta. Scattered lymph nodes in the retroperitoneum are not pathologically enlarged, likely reactive. Reproductive: Uterus is retroverted. Uterus and ovaries are not enlarged. There is a small amount of free fluid in the pelvis that is likely physiologic. Soft tissue gas in the pelvis is likely within the vagina. Other: No free air in the abdomen. Abdominal wall musculature appears intact. Musculoskeletal: No acute or significant osseous findings. IMPRESSION: 1. No acute process demonstrated in the abdomen or pelvis. No evidence of bowel obstruction or inflammation. Normal  appendix. 2. Small amount of free fluid in the pelvis is likely physiologic Electronically Signed   By: Burman Nieves M.D.   On: 05/02/2018 23:22    Procedures Procedures (including critical care time)  Medications Ordered in ED Medications  iopamidol (ISOVUE-300) 61 % injection (has no administration in time range)  sodium chloride 0.9 % bolus 1,000 mL (1,000 mLs Intravenous New Bag/Given 05/02/18 2047)  iopamidol (ISOVUE-300) 61 % injection 100 mL (100 mLs Intravenous Contrast Given 05/02/18 2240)  cefTRIAXone (ROCEPHIN) injection 250 mg (250 mg Intramuscular Given 05/02/18 2319)  azithromycin (ZITHROMAX) tablet 1,000 mg (1,000 mg Oral Given 05/02/18 2320)  lidocaine (PF) (XYLOCAINE) 1 % injection 2 mL (2 mLs Other Given 05/02/18 2320)     Initial Impression / Assessment and Plan / ED Course  I have reviewed the triage vital signs and the nursing notes as well as past medical history.  Pertinent labs & imaging results that were available during my care of the patient were reviewed by me and considered in my medical decision making (see chart for details).  37 year old otherwise well appearing female presents for evaluation of LLQ pain x 1 week.  Admits to diarrhea and malodorous dark vaginal discharge.  Concern for PID, or abscess.  Will obtain CT, labs and pelvic exam and reevaluate.  Pelvic exam with malodorous vaginal discharge.  No CMT. Labs with anemia. Patient states this is chronic and she is supposed to be taking iron, however has not been taking her iron.  Wet prep with clue cells, will treat with Flagyl.  Urine with small leukocytes, rare bacteria will culture.  Will not treat for UTI as she is asymptomatic at this time. CT negative.  Patient is nontoxic, nonseptic appearing, in no apparent distress.  Patient's pain and other symptoms adequately managed in emergency department.  Fluid bolus given. Patient does not meet the SIRS or Sepsis criteria.  On repeat exam patient does not  have a surgical abdomin and there are no peritoneal signs.  No indication of appendicitis, bowel obstruction, bowel perforation, cholecystitis, diverticulitis, PID or ectopic pregnancy. Pt understands that they have GC/Chlamydia cultures pending and that they will need to inform all sexual partners if results return positive. Pt has been treated prophylactically with azithromycin and Rocephin due to pts history, pelvic exam, and wet prep with increased WBCs. Pt not concerning for PID because hemodynamically stable and no cervical motion tenderness on pelvic exam. Pt has also been treated with Flagyl for Bacterial Vaginosis. Pt has been advised to not drink alcohol while on this medication.  Patient to be discharged with instructions to follow up with OBGYN/PCP. Discussed importance of using protection when sexually active. Given strict instructions for follow-up with their primary care physician.  I have also discussed reasons to return immediately to the ER.  Patient expresses understanding and agrees with plan.  Clinical Course as of May 02 2337  Wed May 02, 2018  2307 Clue cells, will treat with Flagyl  Wet prep, genital(!) [BH]  2307 Chronic anemia, Supposed to be taking Iron. Has not taken in 3 months.  Hemoglobin(!): 8.3 [BH]  2308 Small Leukocytes and rare bacteria. No UTI symptoms. Will culture.  Specific Gravity, Urine: 1.027 [BH]    Clinical Course User Index [BH] Shulamis Wenberg A, PA-C    Final Clinical Impressions(s) / ED Diagnoses   Final diagnoses:  BV (bacterial vaginosis)  Left lower quadrant pain    ED Discharge Orders         Ordered    metroNIDAZOLE (FLAGYL) 500 MG tablet  2 times daily     05/02/18 2239           Jolette Lana A, PA-C 05/02/18 2338    Terrilee Files, MD 05/03/18 [phone]

## 2018-05-03 LAB — GC/CHLAMYDIA PROBE AMP (~~LOC~~) NOT AT ARMC
Chlamydia: NEGATIVE
Neisseria Gonorrhea: NEGATIVE

## 2018-05-03 LAB — HIV ANTIBODY (ROUTINE TESTING W REFLEX): HIV Screen 4th Generation wRfx: NONREACTIVE

## 2018-05-03 LAB — RPR: RPR Ser Ql: NONREACTIVE

## 2018-05-04 LAB — URINE CULTURE: Culture: NO GROWTH

## 2019-04-01 ENCOUNTER — Other Ambulatory Visit: Payer: Self-pay

## 2019-04-01 ENCOUNTER — Emergency Department (HOSPITAL_COMMUNITY)
Admission: EM | Admit: 2019-04-01 | Discharge: 2019-04-01 | Disposition: A | Discharge status: Home / Self Care | Payer: Self-pay | Attending: Emergency Medicine | Admitting: Emergency Medicine

## 2019-04-01 ENCOUNTER — Encounter (HOSPITAL_COMMUNITY): Payer: Self-pay | Admitting: Emergency Medicine

## 2019-04-01 DIAGNOSIS — K047 Periapical abscess without sinus: Secondary | ICD-10-CM | POA: Insufficient documentation

## 2019-04-01 MED ORDER — PENICILLIN V POTASSIUM 250 MG PO TABS
500.0000 mg | ORAL_TABLET | Freq: Once | ORAL | Status: AC
  Administered 2019-04-01: 05:00:00 500 mg via ORAL
  Filled 2019-04-01: qty 2

## 2019-04-01 MED ORDER — NAPROXEN 250 MG PO TABS
500.0000 mg | ORAL_TABLET | Freq: Once | ORAL | Status: AC
  Administered 2019-04-01: 05:00:00 500 mg via ORAL
  Filled 2019-04-01: qty 2

## 2019-04-01 MED ORDER — NAPROXEN 500 MG PO TABS: 500.0000 mg | ORAL_TABLET | Freq: Two times a day (BID) | ORAL | 0 refills | Status: DC

## 2019-04-01 MED ORDER — PENICILLIN V POTASSIUM 500 MG PO TABS: 500.0000 mg | ORAL_TABLET | Freq: Four times a day (QID) | ORAL | 0 refills | Status: DC

## 2019-04-01 NOTE — ED Triage Notes (Signed)
C/o R lower dental pain since Saturday.  Taking OTC meds without relief.

## 2019-04-01 NOTE — ED Notes (Signed)
C/o right lower jaw pain , states it started on Sat.

## 2019-04-01 NOTE — ED Provider Notes (Signed)
[facility] EMER[facility]ENCY DEPARTMENT Provider Note   CSN: [id number] Arrival date & time: 04/01/19  5643     History   Chief Complaint Chief Complaint  Patient presents with  . Dental Pain    HPI [name] is a 38 y.o. female.     The history is provided by the patient and medical records.  Dental Pain   38 y.o. F presenting to the ED with right lower dental pain for the past 24 hours.  Pain localized to right lower.  States she feels like she started to get some swelling.  She has never had issues like this before.  She denies any fever, lip or tongue swelling, difficulty swallowing, or throat pain.  She did try taking some Tylenol at home without relief.  She is not currently established with dentist.  Past Medical History:  Diagnosis Date  . BV (bacterial vaginosis)   . Chlamydia   . Pain, dental   . Tinea corporis     There are no active problems to display for this patient.   Past Surgical History:  Procedure Laterality Date  . TUBAL LIGATION       OB History   No obstetric history on file.      Home Medications    Prior to Admission medications   Medication Sig Start Date End Date Taking? Authorizing Provider  HYDROcodone-acetaminophen (NORCO/VICODIN) 5-325 MG tablet Take 1-2 tablets by mouth every 4 (four) hours as needed for moderate pain. Patient not taking: Reported on 05/02/2018 01/24/17   Hinda Kehr, MD  HYDROcodone-ibuprofen (VICOPROFEN) 7.5-200 MG tablet Take 1 tablet by mouth every 6 (six) hours as needed for moderate pain or severe pain. Patient not taking: Reported on 05/02/2018 07/04/15   Clayton Bibles, PA-C  ondansetron (ZOFRAN ODT) 4 MG disintegrating tablet Allow 1-2 tablets to dissolve in your mouth every 8 hours as needed for nausea/vomiting Patient not taking: Reported on 05/02/2018 01/24/17   Hinda Kehr, MD  penicillin v potassium (VEETID) 500 MG tablet Take 1 tablet (500 mg total) by mouth 4 (four) times daily.  Patient not taking: Reported on 05/02/2018 07/04/15   Clayton Bibles, PA-C    Family History Family History  Problem Relation Age of Onset  . Stroke Other   . Diabetes Other   . Hypertension Other     Social History Social History   Tobacco Use  . Smoking status: Never Smoker  . Smokeless tobacco: Never Used  Substance Use Topics  . Alcohol use: Not Currently    Comment: socially  . Drug use: No     Allergies   Patient has no known allergies.   Review of Systems Review of Systems  HENT: Positive for dental problem.   All other systems reviewed and are negative.    Physical Exam Updated Vital Signs BP (!) 152/101 (BP Location: Right Arm)   Pulse 69   Temp 98.7 F (37.1 C) (Oral)   Resp 18   LMP 03/23/2019   SpO2 97%   Physical Exam Vitals signs and nursing note reviewed.  Constitutional:      Appearance: She is well-developed.  HENT:     Head: Normocephalic and atraumatic.     Mouth/Throat:      Comments: Teeth largely in fair dentition,  tooth #28 missing, surrounding gingiva swollen and erythematous without discrete abscess, handling secretions appropriately, no trismus, no facial or neck swelling, normal phonation without stridor Eyes:     Conjunctiva/sclera: Conjunctivae normal.  Pupils: Pupils are equal, round, and reactive to light.  Neck:     Musculoskeletal: Normal range of motion.  Cardiovascular:     Rate and Rhythm: Normal rate and regular rhythm.     Heart sounds: Normal heart sounds.  Pulmonary:     Effort: Pulmonary effort is normal.     Breath sounds: Normal breath sounds.  Abdominal:     General: Bowel sounds are normal.     Palpations: Abdomen is soft.  Musculoskeletal: Normal range of motion.  Skin:    General: Skin is warm and dry.  Neurological:     Mental Status: She is alert and oriented to person, place, and time.      ED Treatments / Results  Labs (all labs ordered are listed, but only abnormal results are  displayed) Labs Reviewed - No data to display  EKG None  Radiology No results found.  Procedures Procedures (including critical care time)  Medications Ordered in ED Medications  penicillin v potassium (VEETID) tablet 500 mg (500 mg Oral Given 04/01/19 0458)  naproxen (NAPROSYN) tablet 500 mg (500 mg Oral Given 04/01/19 0458)     Initial Impression / Assessment and Plan / ED Course  I have reviewed the triage vital signs and the nursing notes.  Pertinent labs & imaging results that were available during my care of the patient were reviewed by me and considered in my medical decision making (see chart for details).  38 y.o. F here with right lower dental pain.  Does have area concerning for infection but no discrete abscess.  Handling secretions well, no lip/tongue swelling, normal phonation without stridor.  Not clinically concerning for ludwig's angina.  Plan to start abx and referred to dentist on call for follow-up.  Can return here for any new/acute changes.  Final Clinical Impressions(s) / ED Diagnoses   Final diagnoses:  Dental infection    ED Discharge Orders         Ordered    penicillin v potassium (VEETID) 500 MG tablet  4 times daily     04/01/19 0445    naproxen (NAPROSYN) 500 MG tablet  2 times daily with meals     04/01/19 0445           Garlon HatchetSanders, Florene Brill M, PA-C 04/01/19 0505    Nira Connardama, Pedro Eduardo, MD 04/01/19 [phone]

## 2019-04-01 NOTE — Discharge Instructions (Signed)
Take the prescribed medication as directed. Follow-up with Dr. Haig Prophet-- call his office this morning for appt. Return to the ED for new or worsening symptoms.

## 2019-06-15 ENCOUNTER — Other Ambulatory Visit: Payer: Self-pay

## 2019-06-15 DIAGNOSIS — Z20828 Contact with and (suspected) exposure to other viral communicable diseases: Secondary | ICD-10-CM | POA: Insufficient documentation

## 2019-06-15 DIAGNOSIS — R197 Diarrhea, unspecified: Secondary | ICD-10-CM | POA: Insufficient documentation

## 2019-06-15 DIAGNOSIS — R112 Nausea with vomiting, unspecified: Secondary | ICD-10-CM | POA: Insufficient documentation

## 2019-06-16 ENCOUNTER — Encounter (HOSPITAL_COMMUNITY): Payer: Self-pay | Admitting: Emergency Medicine

## 2019-06-16 ENCOUNTER — Emergency Department (HOSPITAL_COMMUNITY)
Admission: EM | Admit: 2019-06-16 | Discharge: 2019-06-16 | Disposition: A | Discharge status: Home / Self Care | Payer: Self-pay | Attending: Emergency Medicine | Admitting: Emergency Medicine

## 2019-06-16 ENCOUNTER — Other Ambulatory Visit: Payer: Self-pay

## 2019-06-16 DIAGNOSIS — R112 Nausea with vomiting, unspecified: Secondary | ICD-10-CM

## 2019-06-16 LAB — COMPREHENSIVE METABOLIC PANEL
ALT: 15 U/L (ref 0–44)
AST: 26 U/L (ref 15–41)
Albumin: 4.3 g/dL (ref 3.5–5.0)
Alkaline Phosphatase: 44 U/L (ref 38–126)
Anion gap: 11 (ref 5–15)
BUN: 16 mg/dL (ref 6–20)
CO2: 23 mmol/L (ref 22–32)
Calcium: 9.5 mg/dL (ref 8.9–10.3)
Chloride: 102 mmol/L (ref 98–111)
Creatinine, Ser: 1.05 mg/dL — ABNORMAL HIGH (ref 0.44–1.00)
GFR calc Af Amer: >60 mL/min (ref >60)
GFR calc non Af Amer: >60 mL/min (ref >60)
Glucose, Bld: 106 mg/dL — ABNORMAL HIGH (ref 70–99)
Potassium: 3.1 mmol/L — ABNORMAL LOW (ref 3.5–5.1)
Sodium: 136 mmol/L (ref 135–145)
Total Bilirubin: 0.7 mg/dL (ref 0.3–1.2)
Total Protein: 8.7 g/dL — ABNORMAL HIGH (ref 6.5–8.1)

## 2019-06-16 LAB — URINALYSIS, ROUTINE W REFLEX MICROSCOPIC
Bacteria, UA: NONE SEEN
Bilirubin Urine: NEGATIVE
Glucose, UA: NEGATIVE mg/dL
Hgb urine dipstick: NEGATIVE
Ketones, ur: 5 mg/dL — AB
Leukocytes,Ua: NEGATIVE
Nitrite: NEGATIVE
Protein, ur: 30 mg/dL — AB
Specific Gravity, Urine: 1.025 (ref 1.005–1.030)
pH: 7 (ref 5.0–8.0)

## 2019-06-16 LAB — I-STAT BETA HCG BLOOD, ED (MC, WL, AP ONLY): I-stat hCG, quantitative: <5 m[IU]/mL (ref <5)

## 2019-06-16 LAB — CBC
HCT: 28 % — ABNORMAL LOW (ref 36.0–46.0)
Hemoglobin: 7.8 g/dL — ABNORMAL LOW (ref 12.0–15.0)
MCH: 20.2 pg — ABNORMAL LOW (ref 26.0–34.0)
MCHC: 27.9 g/dL — ABNORMAL LOW (ref 30.0–36.0)
MCV: 72.4 fL — ABNORMAL LOW (ref 80.0–100.0)
Platelets: 375 10*3/uL (ref 150–400)
RBC: 3.87 MIL/uL (ref 3.87–5.11)
RDW: 17.9 % — ABNORMAL HIGH (ref 11.5–15.5)
WBC: 5.9 10*3/uL (ref 4.0–10.5)
nRBC: 0 % (ref 0.0–0.2)

## 2019-06-16 LAB — SARS CORONAVIRUS 2 (TAT 6-24 HRS): SARS Coronavirus 2: NEGATIVE

## 2019-06-16 LAB — LIPASE, BLOOD: Lipase: 37 U/L (ref 11–51)

## 2019-06-16 MED ORDER — SODIUM CHLORIDE 0.9 % IV BOLUS
1000.0000 mL | Freq: Once | INTRAVENOUS | Status: AC
  Administered 2019-06-16: 1000 mL via INTRAVENOUS

## 2019-06-16 MED ORDER — SODIUM CHLORIDE 0.9% FLUSH
3.0000 mL | Freq: Once | INTRAVENOUS | Status: AC
  Administered 2019-06-16: 3 mL via INTRAVENOUS

## 2019-06-16 MED ORDER — ONDANSETRON 4 MG PO TBDP
4.0000 mg | ORAL_TABLET | Freq: Once | ORAL | Status: AC | PRN
  Administered 2019-06-16: 01:00:00 4 mg via ORAL
  Filled 2019-06-16: qty 1

## 2019-06-16 MED ORDER — PROMETHAZINE HCL 25 MG PO TABS: 25.0000 mg | ORAL_TABLET | Freq: Four times a day (QID) | ORAL | 0 refills | Status: DC | PRN

## 2019-06-16 MED ORDER — PROMETHAZINE HCL 25 MG/ML IJ SOLN
12.5000 mg | Freq: Once | INTRAMUSCULAR | Status: AC
  Administered 2019-06-16: 12.5 mg via INTRAVENOUS
  Filled 2019-06-16: qty 1

## 2019-06-16 NOTE — ED Provider Notes (Addendum)
[facility] DEPT Provider Note   CSN: [id number] Arrival date & time: 06/15/19  2314     History   Chief Complaint Chief Complaint  Patient presents with  . Nausea  . Abdominal Pain    HPI [name] is a 38 y.o. female.     Patient presents to the emergency department for evaluation of nausea, vomiting and diarrhea.  Patient reports that symptoms have been present for approximately 24 hours.  She has not been able to hold anything down secondary to the nausea and vomiting.  She is not experiencing any abdominal pain.  She has not had a fever.  Patient denies hematemesis and rectal bleeding.  Patient denies any upper respiratory symptoms.     Past Medical History:  Diagnosis Date  . BV (bacterial vaginosis)   . Chlamydia   . Pain, dental   . Tinea corporis     There are no active problems to display for this patient.   Past Surgical History:  Procedure Laterality Date  . TUBAL LIGATION       OB History   No obstetric history on file.      Home Medications    Prior to Admission medications   Medication Sig Start Date End Date Taking? Authorizing Provider  HYDROcodone-acetaminophen (NORCO/VICODIN) 5-325 MG tablet Take 1-2 tablets by mouth every 4 (four) hours as needed for moderate pain. Patient not taking: Reported on 05/02/2018 01/24/17   Hinda Kehr, MD  HYDROcodone-ibuprofen (VICOPROFEN) 7.5-200 MG tablet Take 1 tablet by mouth every 6 (six) hours as needed for moderate pain or severe pain. Patient not taking: Reported on 05/02/2018 07/04/15   Clayton Bibles, PA-C  naproxen (NAPROSYN) 500 MG tablet Take 1 tablet (500 mg total) by mouth 2 (two) times daily with a meal. 04/01/19   Larene Pickett, PA-C  ondansetron (ZOFRAN ODT) 4 MG disintegrating tablet Allow 1-2 tablets to dissolve in your mouth every 8 hours as needed for nausea/vomiting Patient not taking: Reported on 05/02/2018 01/24/17   Hinda Kehr, MD  penicillin v  potassium (VEETID) 500 MG tablet Take 1 tablet (500 mg total) by mouth 4 (four) times daily. 04/01/19   Larene Pickett, PA-C    Family History Family History  Problem Relation Age of Onset  . Stroke Other   . Diabetes Other   . Hypertension Other     Social History Social History   Tobacco Use  . Smoking status: Never Smoker  . Smokeless tobacco: Never Used  Substance Use Topics  . Alcohol use: Not Currently    Comment: socially  . Drug use: No     Allergies   Patient has no known allergies.   Review of Systems Review of Systems  Gastrointestinal: Positive for nausea and vomiting.  All other systems reviewed and are negative.    Physical Exam Updated Vital Signs BP (!) 165/90   Pulse 60   Temp 98.8 F (37.1 C) (Oral)   Resp 17   Ht 5\' 6"  (1.676 m)   Wt 74.8 kg   LMP 06/07/2019   SpO2 100%   BMI 26.63 kg/m   Physical Exam Vitals signs and nursing note reviewed.  Constitutional:      General: She is not in acute distress.    Appearance: Normal appearance. She is well-developed.  HENT:     Head: Normocephalic and atraumatic.     Right Ear: Hearing normal.     Left Ear: Hearing normal.  Nose: Nose normal.  Eyes:     Conjunctiva/sclera: Conjunctivae normal.     Pupils: Pupils are equal, round, and reactive to light.  Neck:     Musculoskeletal: Normal range of motion and neck supple.  Cardiovascular:     Rate and Rhythm: Regular rhythm.     Heart sounds: S1 normal and S2 normal. No murmur. No friction rub. No gallop.   Pulmonary:     Effort: Pulmonary effort is normal. No respiratory distress.     Breath sounds: Normal breath sounds.  Chest:     Chest wall: No tenderness.  Abdominal:     General: Bowel sounds are normal.     Palpations: Abdomen is soft.     Tenderness: There is no abdominal tenderness. There is no guarding or rebound. Negative signs include Murphy's sign and McBurney's sign.     Hernia: No hernia is present.  Musculoskeletal:  Normal range of motion.  Skin:    General: Skin is warm and dry.     Findings: No rash.  Neurological:     Mental Status: She is alert and oriented to person, place, and time.     GCS: GCS eye subscore is 4. GCS verbal subscore is 5. GCS motor subscore is 6.     Cranial Nerves: No cranial nerve deficit.     Sensory: No sensory deficit.     Coordination: Coordination normal.  Psychiatric:        Speech: Speech normal.        Behavior: Behavior normal.        Thought Content: Thought content normal.      ED Treatments / Results  Labs (all labs ordered are listed, but only abnormal results are displayed) Labs Reviewed  COMPREHENSIVE METABOLIC PANEL - Abnormal; Notable for the following components:      Result Value   Potassium 3.1 (*)    Glucose, Bld 106 (*)    Creatinine, Ser 1.05 (*)    Total Protein 8.7 (*)    All other components within normal limits  CBC - Abnormal; Notable for the following components:   Hemoglobin 7.8 (*)    HCT 28.0 (*)    MCV 72.4 (*)    MCH 20.2 (*)    MCHC 27.9 (*)    RDW 17.9 (*)    All other components within normal limits  URINALYSIS, ROUTINE W REFLEX MICROSCOPIC - Abnormal; Notable for the following components:   Ketones, ur 5 (*)    Protein, ur 30 (*)    All other components within normal limits  SARS CORONAVIRUS 2 (TAT 6-24 HRS)  LIPASE, BLOOD  I-STAT BETA HCG BLOOD, ED ([facility], [facility], [facility] ONLY)    EKG None  Radiology No results found.  Procedures Procedures (including critical care time)  Medications Ordered in ED Medications  sodium chloride flush (NS) 0.9 % injection 3 mL (3 mLs Intravenous Given 06/16/19 0054)  ondansetron (ZOFRAN-ODT) disintegrating tablet 4 mg (4 mg Oral Given 06/16/19 0054)  sodium chloride 0.9 % bolus 1,000 mL (1,000 mLs Intravenous Started During Downtime 06/16/19 0224)  promethazine (PHENERGAN) injection 12.5 mg (12.5 mg Intravenous Given During Downtime 06/16/19 0225)     Initial Impression / Assessment and  Plan / ED Course  I have reviewed the triage vital signs and the nursing notes.  Pertinent labs & imaging results that were available during my care of the patient were reviewed by me and considered in my medical decision making (see chart for details).  Patient presents to the emergency department for evaluation of nausea and vomiting.  Patient not able to hold down any solids or liquids for 24 hours, has mild dehydration.  Lab work, however, is unremarkable.  Abdominal exam is benign, no tenderness or concern for acute surgical process.  Patient treated with antiemetics and IV fluids, will continue supportive care as an outpatient.   Final Clinical Impressions(s) / ED Diagnoses   Final diagnoses:  Non-intractable vomiting with nausea, unspecified vomiting type    ED Discharge Orders    None       Gilda CreasePollina, Augusta Mirkin J, MD 06/16/19 [id number]    Gilda CreasePollina, Ashtian Villacis J, MD 06/16/19 [phone]

## 2019-06-16 NOTE — ED Triage Notes (Signed)
Patient is complaining of abdominal pain, nausea, and vomiting. Patient states it started Friday night (06/14/2019).

## 2019-07-18 ENCOUNTER — Other Ambulatory Visit: Payer: Self-pay

## 2019-07-18 ENCOUNTER — Ambulatory Visit (INDEPENDENT_AMBULATORY_CARE_PROVIDER_SITE_OTHER): Payer: Self-pay

## 2019-07-18 ENCOUNTER — Encounter (HOSPITAL_COMMUNITY): Payer: Self-pay | Admitting: Emergency Medicine

## 2019-07-18 ENCOUNTER — Ambulatory Visit (HOSPITAL_COMMUNITY)
Admission: EM | Admit: 2019-07-18 | Discharge: 2019-07-18 | Disposition: A | Discharge status: Home / Self Care | Payer: Self-pay | Attending: Family Medicine | Admitting: Family Medicine

## 2019-07-18 DIAGNOSIS — M7501 Adhesive capsulitis of right shoulder: Secondary | ICD-10-CM

## 2019-07-18 MED ORDER — PREDNISONE 20 MG PO TABS: ORAL_TABLET | ORAL | 0 refills | Status: DC

## 2019-07-18 NOTE — ED Triage Notes (Signed)
Pt complains of right shoulder, upper back, and upper arm pain x1 week.  She denies any injury.  She has tried several OTC medications and muscle relaxer's with no relief.

## 2019-07-18 NOTE — Discharge Instructions (Addendum)
RIGHT SHOULDER - 2+ VIEW   COMPARISON:  None.   FINDINGS: There is no evidence of fracture or dislocation. There is no evidence of arthropathy or other focal bone abnormality. Soft tissues are unremarkable.   IMPRESSION: Negative.

## 2019-07-18 NOTE — ED Provider Notes (Signed)
[facility]    CSN: [id number] Arrival date & time: 07/18/19  1411      History   Chief Complaint Chief Complaint  Patient presents with  . Shoulder Pain    right    HPI [name] is a 38 y.o. female.   Initial [facility] visit  Pt complains of right shoulder, upper back, and upper arm pain x1 week.  She denies any injury.  She has tried several OTC medications and muscle relaxer's with no relief.  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  Worse with movement.  Tender to palpate the shoulder area both front and back.  Pain also in right lower neck area.     Past Medical History:  Diagnosis Date  . BV (bacterial vaginosis)   . Chlamydia   . Pain, dental   . Tinea corporis     There are no active problems to display for this patient.   Past Surgical History:  Procedure Laterality Date  . TUBAL LIGATION      OB History   No obstetric history on file.      Home Medications    Prior to Admission medications   Medication Sig Start Date End Date Taking? Authorizing Provider  predniSONE (DELTASONE) 20 MG tablet Two daily with food 07/18/19   Elvina Sidle, MD  promethazine (PHENERGAN) 25 MG tablet Take 1 tablet (25 mg total) by mouth every 6 (six) hours as needed for nausea or vomiting. 06/16/19   Gilda Crease, MD    Family History Family History  Problem Relation Age of Onset  . Stroke Other   . Diabetes Other   . Hypertension Other     Social History Social History   Tobacco Use  . Smoking status: Never Smoker  . Smokeless tobacco: Never Used  Substance Use Topics  . Alcohol use: Not Currently    Comment: socially  . Drug use: No     Allergies   Patient has no known allergies.   Review of Systems Review of Systems  Constitutional: Negative.   Respiratory: Negative.   Cardiovascular: Negative.   All other systems reviewed and are  negative.    Physical Exam Triage Vital Signs ED Triage Vitals  Enc Vitals Group     BP 07/18/19 1440 124/66     Pulse Rate 07/18/19 1440 75     Resp 07/18/19 1440 16     Temp 07/18/19 1440 98.7 F (37.1 C)     Temp Source 07/18/19 1440 Oral     SpO2 07/18/19 1440 100 %     Weight --      Height --      Head Circumference --      Peak Flow --      Pain Score 07/18/19 1438 8     Pain Loc --      Pain Edu? --      Excl. in GC? --    No data found.  Updated Vital Signs BP 124/66 (BP Location: Left Arm)   Pulse 75   Temp 98.7 F (37.1 C) (Oral)   Resp 16   LMP 07/02/2019 (Approximate)   SpO2 100%    Physical Exam Vitals signs and nursing note reviewed.  Constitutional:      Appearance: Normal appearance. She is normal weight.  HENT:     Mouth/Throat:     Pharynx: Oropharynx is clear.  Eyes:  Conjunctiva/sclera: Conjunctivae normal.  Neck:     Musculoskeletal: Normal range of motion and neck supple.  Cardiovascular:     Rate and Rhythm: Normal rate.  Pulmonary:     Effort: Pulmonary effort is normal.  Musculoskeletal:        General: No deformity.     Comments: Tender right shoulder in anterior and posterior joint line.  Patient unwilling to move humerus in any direction. Tender trapezius from acromion to lower right neck  Skin:    General: Skin is warm and dry.  Neurological:     General: No focal deficit present.     Mental Status: She is alert.  Psychiatric:        Mood and Affect: Mood normal.        Behavior: Behavior normal.        Thought Content: Thought content normal.        Judgment: Judgment normal.      UC Treatments / Results  Labs (all labs ordered are listed, but only abnormal results are displayed) Labs Reviewed - No data to display  EKG   Radiology Dg Shoulder Right  Result Date: 07/18/2019 CLINICAL DATA:  Right shoulder pain EXAM: RIGHT SHOULDER - 2+ VIEW COMPARISON:  None. FINDINGS: There is no evidence of fracture or  dislocation. There is no evidence of arthropathy or other focal bone abnormality. Soft tissues are unremarkable. IMPRESSION: Negative. Electronically Signed   By: Donavan Foil M.D.   On: 07/18/2019 15:27    Procedures Procedures (including critical care time)  Medications Ordered in UC Medications - No data to display  Initial Impression / Assessment and Plan / UC Course  I have reviewed the triage vital signs and the nursing notes.  Pertinent labs & imaging results that were available during my care of the patient were reviewed by me and considered in my medical decision making (see chart for details).    Final Clinical Impressions(s) / UC Diagnoses   Final diagnoses:  Adhesive capsulitis of right shoulder     Discharge Instructions     RIGHT SHOULDER - 2+ VIEW   COMPARISON:  None.   FINDINGS: There is no evidence of fracture or dislocation. There is no evidence of arthropathy or other focal bone abnormality. Soft tissues are unremarkable.   IMPRESSION: Negative.      ED Prescriptions    Medication Sig Dispense Auth. Provider   predniSONE (DELTASONE) 20 MG tablet Two daily with food 10 tablet Robyn Haber, MD     I have reviewed the PDMP during this encounter.   Robyn Haber, MD 07/18/19 1540

## 2020-02-10 ENCOUNTER — Other Ambulatory Visit: Payer: Self-pay

## 2020-02-10 ENCOUNTER — Ambulatory Visit
Admission: EM | Admit: 2020-02-10 | Discharge: 2020-02-10 | Disposition: A | Discharge status: Home / Self Care | Payer: Medicaid Other | Attending: Emergency Medicine | Admitting: Emergency Medicine

## 2020-02-10 ENCOUNTER — Encounter: Payer: Self-pay | Admitting: Emergency Medicine

## 2020-02-10 DIAGNOSIS — R05 Cough: Secondary | ICD-10-CM

## 2020-02-10 DIAGNOSIS — R059 Cough, unspecified: Secondary | ICD-10-CM

## 2020-02-10 MED ORDER — PREDNISONE 20 MG PO TABS: ORAL_TABLET | ORAL | 0 refills | Status: DC

## 2020-02-10 MED ORDER — FLUTICASONE PROPIONATE 50 MCG/ACT NA SUSP: 1.0000 | Freq: Every day | NASAL | 0 refills | Status: DC

## 2020-02-10 MED ORDER — AZITHROMYCIN 250 MG PO TABS: 250.0000 mg | ORAL_TABLET | Freq: Every day | ORAL | 0 refills | Status: DC

## 2020-02-10 MED ORDER — PROMETHAZINE HCL 25 MG PO TABS: 25.0000 mg | ORAL_TABLET | Freq: Four times a day (QID) | ORAL | 0 refills | Status: DC | PRN

## 2020-02-10 MED ORDER — BENZONATATE 100 MG PO CAPS: 100.0000 mg | ORAL_CAPSULE | Freq: Three times a day (TID) | ORAL | 0 refills | Status: DC

## 2020-02-10 MED ORDER — CETIRIZINE HCL 10 MG PO TABS: 10.0000 mg | ORAL_TABLET | Freq: Every day | ORAL | 0 refills | Status: DC

## 2020-02-10 NOTE — ED Triage Notes (Signed)
Pt presents to [facility] for assessment of cough which leads to emesis, sore throat, loss of taste and smell, nausea and vomiting, diarrhea.

## 2020-02-10 NOTE — Discharge Instructions (Signed)

## 2020-02-10 NOTE — ED Provider Notes (Signed)
[facility]    CSN: [id number] Arrival date & time: 02/10/20  0272      History   Chief Complaint Chief Complaint  Patient presents with  . URI    HPI [name] is a 39 y.o. female presenting for URI symptoms since Monday.  Endorsing cough, nasal congestion and pressure, sore throat, loss of taste and smell, nausea, vomiting, loose stools.  States her boyfriend and another family members have since been sick as well: No one has undergone Covid testing.  Denies shortness of breath, chest pain, palpitations.  States emesis is after coughing.  Reports good oral intake.  Past Medical History:  Diagnosis Date  . BV (bacterial vaginosis)   . Chlamydia   . Pain, dental   . Tinea corporis     There are no problems to display for this patient.   Past Surgical History:  Procedure Laterality Date  . TUBAL LIGATION      OB History   No obstetric history on file.      Home Medications    Prior to Admission medications   Medication Sig Start Date End Date Taking? Authorizing Provider  azithromycin (ZITHROMAX) 250 MG tablet Take 1 tablet (250 mg total) by mouth daily. Take first 2 tablets together, then 1 every day until finished. 02/10/20   Hall-Potvin, [place], PA-C  benzonatate (TESSALON) 100 MG capsule Take 1 capsule (100 mg total) by mouth every 8 (eight) hours. 02/10/20   Hall-Potvin, [place], PA-C  cetirizine (ZYRTEC ALLERGY) 10 MG tablet Take 1 tablet (10 mg total) by mouth daily. 02/10/20   Hall-Potvin, [place], PA-C  fluticasone (FLONASE) 50 MCG/ACT nasal spray Place 1 spray into both nostrils daily. 02/10/20   Hall-Potvin, [place], PA-C  predniSONE (DELTASONE) 20 MG tablet Two daily with food 02/10/20   Hall-Potvin, [place], PA-C  promethazine (PHENERGAN) 25 MG tablet Take 1 tablet (25 mg total) by mouth every 6 (six) hours as needed for nausea or vomiting. 02/10/20   Hall-Potvin, [place], PA-C    Family History Family History  Problem Relation  Age of Onset  . Stroke Other   . Diabetes Other   . Hypertension Other     Social History Social History   Tobacco Use  . Smoking status: Never Smoker  . Smokeless tobacco: Never Used  Substance Use Topics  . Alcohol use: Not Currently    Comment: socially  . Drug use: No     Allergies   Patient has no known allergies.   Review of Systems As per HPI   Physical Exam Triage Vital Signs ED Triage Vitals  Enc Vitals Group     BP      Pulse      Resp      Temp      Temp src      SpO2      Weight      Height      Head Circumference      Peak Flow      Pain Score      Pain Loc      Pain Edu?      Excl. in GC?    No data found.  Updated Vital Signs BP (!) 146/87 (BP Location: Left Arm)   Pulse 93   Temp 98.2 F (36.8 C) (Oral)   Resp 18   LMP 02/09/2020 Comment: tubal ligation  SpO2 100%   Visual Acuity Right Eye Distance:   Left Eye Distance:   Bilateral Distance:  Right Eye Near:   Left Eye Near:    Bilateral Near:     Physical Exam Constitutional:      General: She is not in acute distress.    Appearance: She is obese. She is ill-appearing. She is not toxic-appearing or diaphoretic.  HENT:     Head: Normocephalic and atraumatic.     Right Ear: Tympanic membrane, ear canal and external ear normal.     Left Ear: Tympanic membrane, ear canal and external ear normal.     Nose: Rhinorrhea present.     Mouth/Throat:     Mouth: Mucous membranes are moist.     Pharynx: Oropharynx is clear. No oropharyngeal exudate or posterior oropharyngeal erythema.  Eyes:     General: No scleral icterus.    Conjunctiva/sclera: Conjunctivae normal.     Pupils: Pupils are equal, round, and reactive to light.  Neck:     Comments: Trachea midline, negative JVD Cardiovascular:     Rate and Rhythm: Normal rate and regular rhythm.     Heart sounds: No murmur heard.  No gallop.   Pulmonary:     Effort: Pulmonary effort is normal. No respiratory distress.      Breath sounds: Wheezing and rhonchi present. No rales.     Comments: Scattered Abdominal:     Palpations: Abdomen is soft.     Tenderness: There is no abdominal tenderness.  Musculoskeletal:     Cervical back: Neck supple. No tenderness.     Right lower leg: No edema.     Left lower leg: No edema.  Lymphadenopathy:     Cervical: No cervical adenopathy.  Skin:    Capillary Refill: Capillary refill takes less than 2 seconds.     Coloration: Skin is not jaundiced or pale.     Findings: No rash.  Neurological:     General: No focal deficit present.     Mental Status: She is alert and oriented to person, place, and time.      UC Treatments / Results  Labs (all labs ordered are listed, but only abnormal results are displayed) Labs Reviewed  NOVEL CORONAVIRUS, NAA    EKG   Radiology No results found.  Procedures Procedures (including critical care time)  Medications Ordered in UC Medications - No data to display  Initial Impression / Assessment and Plan / UC Course  I have reviewed the triage vital signs and the nursing notes.  Pertinent labs & imaging results that were available during my care of the patient were reviewed by me and considered in my medical decision making (see chart for details).     Patient afebrile, nontoxic, with SpO2 100%.  Covid PCR pending.  Patient to quarantine until results are back.  Patient has posttussive emesis: Start azithromycin as adjuvant therapy to supportive care that is further outlined below.  Return precautions discussed, patient verbalized understanding and is agreeable to plan. Final Clinical Impressions(s) / UC Diagnoses   Final diagnoses:  Cough     Discharge Instructions     Tessalon for cough. Start flonase, atrovent nasal spray for nasal congestion/drainage. You can use over the counter nasal saline rinse such as neti pot for nasal congestion. Keep hydrated, your urine should be clear to pale yellow in color.  Tylenol/motrin for fever and pain. Monitor for any worsening of symptoms, chest pain, shortness of breath, wheezing, swelling of the throat, go to the emergency department for further evaluation needed.     ED Prescriptions  Medication Sig Dispense Auth. Provider   predniSONE (DELTASONE) 20 MG tablet Two daily with food 5 tablet Hall-Potvin, [place], PA-C   promethazine (PHENERGAN) 25 MG tablet Take 1 tablet (25 mg total) by mouth every 6 (six) hours as needed for nausea or vomiting. 15 tablet Hall-Potvin, [place], PA-C   cetirizine (ZYRTEC ALLERGY) 10 MG tablet Take 1 tablet (10 mg total) by mouth daily. 30 tablet Hall-Potvin, [place], PA-C   fluticasone (FLONASE) 50 MCG/ACT nasal spray Place 1 spray into both nostrils daily. 16 g Hall-Potvin, [place], PA-C   benzonatate (TESSALON) 100 MG capsule Take 1 capsule (100 mg total) by mouth every 8 (eight) hours. 21 capsule Hall-Potvin, [place], PA-C   azithromycin (ZITHROMAX) 250 MG tablet Take 1 tablet (250 mg total) by mouth daily. Take first 2 tablets together, then 1 every day until finished. 6 tablet Hall-Potvin, [place], PA-C     PDMP not reviewed this encounter.   Hall-Potvin, [place], [place] 02/10/20 1021

## 2020-02-10 NOTE — ED Notes (Signed)
Patient actively coughing in room which is causing her to gag, no actual emesis noted at this time.

## 2020-02-10 NOTE — ED Notes (Signed)
COVID swab obtained.

## 2020-02-11 LAB — NOVEL CORONAVIRUS, NAA: SARS-CoV-2, NAA: NOT DETECTED

## 2020-02-11 LAB — SARS-COV-2, NAA 2 DAY TAT

## 2020-05-09 ENCOUNTER — Encounter (HOSPITAL_COMMUNITY): Payer: Self-pay | Admitting: *Deleted

## 2020-05-09 ENCOUNTER — Other Ambulatory Visit: Payer: Self-pay

## 2020-05-09 ENCOUNTER — Emergency Department (HOSPITAL_COMMUNITY)
Admission: EM | Admit: 2020-05-09 | Discharge: 2020-05-09 | Disposition: A | Discharge status: Home / Self Care | Payer: Medicaid Other | Attending: Emergency Medicine | Admitting: Emergency Medicine

## 2020-05-09 DIAGNOSIS — R197 Diarrhea, unspecified: Secondary | ICD-10-CM | POA: Diagnosis not present

## 2020-05-09 DIAGNOSIS — R112 Nausea with vomiting, unspecified: Secondary | ICD-10-CM | POA: Insufficient documentation

## 2020-05-09 DIAGNOSIS — R109 Unspecified abdominal pain: Secondary | ICD-10-CM | POA: Diagnosis present

## 2020-05-09 LAB — COMPREHENSIVE METABOLIC PANEL
ALT: 12 U/L (ref 0–44)
AST: 25 U/L (ref 15–41)
Albumin: 4.8 g/dL (ref 3.5–5.0)
Alkaline Phosphatase: 41 U/L (ref 38–126)
Anion gap: 12 (ref 5–15)
BUN: 15 mg/dL (ref 6–20)
CO2: 19 mmol/L — ABNORMAL LOW (ref 22–32)
Calcium: 10.1 mg/dL (ref 8.9–10.3)
Chloride: 103 mmol/L (ref 98–111)
Creatinine, Ser: 0.92 mg/dL (ref 0.44–1.00)
GFR calc Af Amer: >60 mL/min (ref >60)
GFR calc non Af Amer: >60 mL/min (ref >60)
Glucose, Bld: 134 mg/dL — ABNORMAL HIGH (ref 70–99)
Potassium: 3.3 mmol/L — ABNORMAL LOW (ref 3.5–5.1)
Sodium: 134 mmol/L — ABNORMAL LOW (ref 135–145)
Total Bilirubin: 0.9 mg/dL (ref 0.3–1.2)
Total Protein: 9.1 g/dL — ABNORMAL HIGH (ref 6.5–8.1)

## 2020-05-09 LAB — CBC
HCT: 27.8 % — ABNORMAL LOW (ref 36.0–46.0)
Hemoglobin: 8.4 g/dL — ABNORMAL LOW (ref 12.0–15.0)
MCH: 20 pg — ABNORMAL LOW (ref 26.0–34.0)
MCHC: 30.2 g/dL (ref 30.0–36.0)
MCV: 66 fL — ABNORMAL LOW (ref 80.0–100.0)
Platelets: 366 10*3/uL (ref 150–400)
RBC: 4.21 MIL/uL (ref 3.87–5.11)
RDW: 16.7 % — ABNORMAL HIGH (ref 11.5–15.5)
WBC: 8.5 10*3/uL (ref 4.0–10.5)
nRBC: 0 % (ref 0.0–0.2)

## 2020-05-09 LAB — I-STAT BETA HCG BLOOD, ED (MC, WL, AP ONLY): I-stat hCG, quantitative: <5 m[IU]/mL (ref <5)

## 2020-05-09 LAB — LIPASE, BLOOD: Lipase: 43 U/L (ref 11–51)

## 2020-05-09 MED ORDER — ONDANSETRON HCL 4 MG/2ML IJ SOLN
4.0000 mg | Freq: Once | INTRAMUSCULAR | Status: AC
  Administered 2020-05-09: 4 mg via INTRAVENOUS
  Filled 2020-05-09: qty 2

## 2020-05-09 MED ORDER — SODIUM CHLORIDE 0.9 % IV BOLUS
1000.0000 mL | Freq: Once | INTRAVENOUS | Status: AC
  Administered 2020-05-09: 1000 mL via INTRAVENOUS

## 2020-05-09 MED ORDER — KETOROLAC TROMETHAMINE 30 MG/ML IJ SOLN
30.0000 mg | Freq: Once | INTRAMUSCULAR | Status: AC
  Administered 2020-05-09: 30 mg via INTRAMUSCULAR
  Filled 2020-05-09: qty 1

## 2020-05-09 MED ORDER — ONDANSETRON 4 MG PO TBDP: 4.0000 mg | ORAL_TABLET | Freq: Three times a day (TID) | ORAL | 0 refills | Status: DC | PRN

## 2020-05-09 MED ORDER — DICYCLOMINE HCL 10 MG/ML IM SOLN
20.0000 mg | Freq: Once | INTRAMUSCULAR | Status: AC
  Administered 2020-05-09: 20 mg via INTRAMUSCULAR
  Filled 2020-05-09: qty 2

## 2020-05-09 MED ORDER — POTASSIUM CHLORIDE CRYS ER 20 MEQ PO TBCR
40.0000 meq | EXTENDED_RELEASE_TABLET | Freq: Once | ORAL | Status: AC
  Administered 2020-05-09: 40 meq via ORAL
  Filled 2020-05-09: qty 2

## 2020-05-09 NOTE — ED Triage Notes (Signed)
Reports the following starting last night: N/V/D  Chills Clammy

## 2020-05-09 NOTE — Discharge Instructions (Signed)
Take the Zofran as needed instead of the Phenergan. Follow-up with your primary care provider. Return to the ER if you start to experience worsening abdominal pain, vomiting, chest pain or shortness of breath.

## 2020-05-09 NOTE — ED Provider Notes (Signed)
[facility]-EMERGENCY DEPT Provider Note   CSN: [id number] Arrival date & time: 05/09/20  8315     History Chief Complaint  Patient presents with  . Abdominal Pain  . Nausea  . Emesis  . Chills  . Diarrhea    [name] is a 39 y.o. female who presents to ED with abdominal pain, vomiting, diarrhea and chills for the past 12 hours.  States that she ate pizza from [employer] prior to symptom onset but no sick contacts with similar symptoms.  She tried taking 1 dose of promethazine approximately 10 hours ago without significant improvement.  Reports body aches as well.  Denies any urinary symptoms, cough, shortness of breath, chest pain or prior abdominal surgeries.  She denies possibility of pregnancy.  HPI     Past Medical History:  Diagnosis Date  . BV (bacterial vaginosis)   . Chlamydia   . Pain, dental   . Tinea corporis     There are no problems to display for this patient.   Past Surgical History:  Procedure Laterality Date  . TUBAL LIGATION       OB History   No obstetric history on file.     Family History  Problem Relation Age of Onset  . Stroke Other   . Diabetes Other   . Hypertension Other     Social History   Tobacco Use  . Smoking status: Never Smoker  . Smokeless tobacco: Never Used  Substance Use Topics  . Alcohol use: Not Currently    Comment: socially  . Drug use: No    Home Medications Prior to Admission medications   Medication Sig Start Date End Date Taking? Authorizing Provider  azithromycin (ZITHROMAX) 250 MG tablet Take 1 tablet (250 mg total) by mouth daily. Take first 2 tablets together, then 1 every day until finished. 02/10/20   Hall-Potvin, [place], PA-C  benzonatate (TESSALON) 100 MG capsule Take 1 capsule (100 mg total) by mouth every 8 (eight) hours. 02/10/20   Hall-Potvin, [place], PA-C  cetirizine (ZYRTEC ALLERGY) 10 MG tablet Take 1 tablet (10 mg total) by mouth daily. 02/10/20    Hall-Potvin, [place], PA-C  fluticasone (FLONASE) 50 MCG/ACT nasal spray Place 1 spray into both nostrils daily. 02/10/20   Hall-Potvin, [place], PA-C  ondansetron (ZOFRAN ODT) 4 MG disintegrating tablet Take 1 tablet (4 mg total) by mouth every 8 (eight) hours as needed for nausea or vomiting. 05/09/20   Idelle Leech, [place], PA-C  predniSONE (DELTASONE) 20 MG tablet Two daily with food 02/10/20   Hall-Potvin, [place], PA-C  promethazine (PHENERGAN) 25 MG tablet Take 1 tablet (25 mg total) by mouth every 6 (six) hours as needed for nausea or vomiting. 02/10/20 05/09/20  Hall-Potvin, [place], PA-C    Allergies    Patient has no known allergies.  Review of Systems   Review of Systems  Constitutional: Positive for chills. Negative for appetite change and fever.  HENT: Negative for ear pain, rhinorrhea, sneezing and sore throat.   Eyes: Negative for photophobia and visual disturbance.  Respiratory: Negative for cough, chest tightness, shortness of breath and wheezing.   Cardiovascular: Negative for chest pain and palpitations.  Gastrointestinal: Positive for abdominal pain, diarrhea, nausea and vomiting. Negative for blood in stool and constipation.  Genitourinary: Negative for dysuria, hematuria and urgency.  Musculoskeletal: Positive for myalgias.  Skin: Negative for rash.  Neurological: Negative for dizziness, weakness and light-headedness.    Physical Exam Updated Vital Signs BP 122/82   Pulse Marland Kitchen)  58   Temp 98.8 F (37.1 C) (Oral)   Resp 19   Ht 5\' 6"  (1.676 m)   Wt 85.7 kg   SpO2 100%   BMI 30.51 kg/m   Physical Exam Vitals and nursing note reviewed.  Constitutional:      General: She is not in acute distress.    Appearance: She is well-developed.  HENT:     Head: Normocephalic and atraumatic.     Nose: Nose normal.  Eyes:     General: No scleral icterus.       Left eye: No discharge.     Conjunctiva/sclera: Conjunctivae normal.  Cardiovascular:     Rate and Rhythm: Normal  rate and regular rhythm.     Heart sounds: Normal heart sounds. No murmur heard.  No friction rub. No gallop.   Pulmonary:     Effort: Pulmonary effort is normal. No respiratory distress.     Breath sounds: Normal breath sounds.  Abdominal:     General: Bowel sounds are normal. There is no distension.     Palpations: Abdomen is soft.     Tenderness: There is no abdominal tenderness. There is no guarding.  Musculoskeletal:        General: Normal range of motion.     Cervical back: Normal range of motion and neck supple.  Skin:    General: Skin is warm and dry.     Findings: No rash.  Neurological:     Mental Status: She is alert.     Motor: No abnormal muscle tone.     Coordination: Coordination normal.     ED Results / Procedures / Treatments   Labs (all labs ordered are listed, but only abnormal results are displayed) Labs Reviewed  COMPREHENSIVE METABOLIC PANEL - Abnormal; Notable for the following components:      Result Value   Sodium 134 (*)    Potassium 3.3 (*)    CO2 19 (*)    Glucose, Bld 134 (*)    Total Protein 9.1 (*)    All other components within normal limits  CBC - Abnormal; Notable for the following components:   Hemoglobin 8.4 (*)    HCT 27.8 (*)    MCV 66.0 (*)    MCH 20.0 (*)    RDW 16.7 (*)    All other components within normal limits  LIPASE, BLOOD  I-STAT BETA HCG BLOOD, ED ([facility], [facility], [facility] ONLY)    EKG None  Radiology No results found.  Procedures Procedures (including critical care time)  Medications Ordered in ED Medications  potassium chloride SA (KLOR-CON) CR tablet 40 mEq (has no administration in time range)  sodium chloride 0.9 % bolus 1,000 mL (1,000 mLs Intravenous New Bag/Given 05/09/20 0842)  ondansetron (ZOFRAN) injection 4 mg (4 mg Intravenous Given 05/09/20 05/11/20)    ED Course  I have reviewed the triage vital signs and the nursing notes.  Pertinent labs & imaging results that were available during my care of the patient  were reviewed by me and considered in my medical decision making (see chart for details).    MDM Rules/Calculators/A&P                          39 year old female who presents to ED with a chief complaint of abdominal pain, vomiting, diarrhea and chills for the past 12 hours.  Symptoms began after she ate pizza from [employer] prior to symptom onset.  No improvement  noted with 1 dose of promethazine.  Reports body aches.  No respiratory symptoms, chest pain.  On exam patient with generalized abdominal tenderness without focal tenderness.  Vital signs within normal limits.  CBC with hemoglobin of 8.4 which is similar to her baseline.  CMP with hypokalemia of 3.3 which is repleted orally.  Lipase unremarkable.  hCG is negative.  Patient given IV fluids and Zofran here with resolution of her symptoms.  Repeat abdominal exams are benign.  She is resting comfortably.  She remains hemodynamically stable.  Suspect that her symptoms are viral in nature.  Doubt an emergent or surgical cause such as cholecystitis, appendicitis.  Patient is agreeable with continuing antiemetics, still advancing her diet and increasing hydration.  Return precautions given.  Patient is hemodynamically stable, in NAD, and able to ambulate in the ED. Evaluation does not show pathology that would require ongoing emergent intervention or inpatient treatment. I explained the diagnosis to the patient. Pain has been managed and has no complaints prior to discharge. Patient is comfortable with above plan and is stable for discharge at this time. All questions were answered prior to disposition. Strict return precautions for returning to the ED were discussed. Encouraged follow up with PCP.   An After Visit Summary was printed and given to the patient.   Portions of this note were generated with Scientist, clinical (histocompatibility and immunogenetics). Dictation errors may occur despite best attempts at proofreading.  Final Clinical Impression(s) / ED  Diagnoses Final diagnoses:  Nausea vomiting and diarrhea    Rx / DC Orders ED Discharge Orders         Ordered    ondansetron (ZOFRAN ODT) 4 MG disintegrating tablet  Every 8 hours PRN        05/09/20 0938           Dietrich Pates, PA-C 05/09/20 0940    Geoffery Lyons, MD 05/09/20 1423

## 2020-10-03 IMAGING — DX DG SHOULDER 2+V*R*
4 series · 4 of 4 positions shown · non-contrast
Comparison: None.

CLINICAL DATA: Right shoulder pain

EXAM:
RIGHT SHOULDER - 2+ VIEW

[shoulder ap]
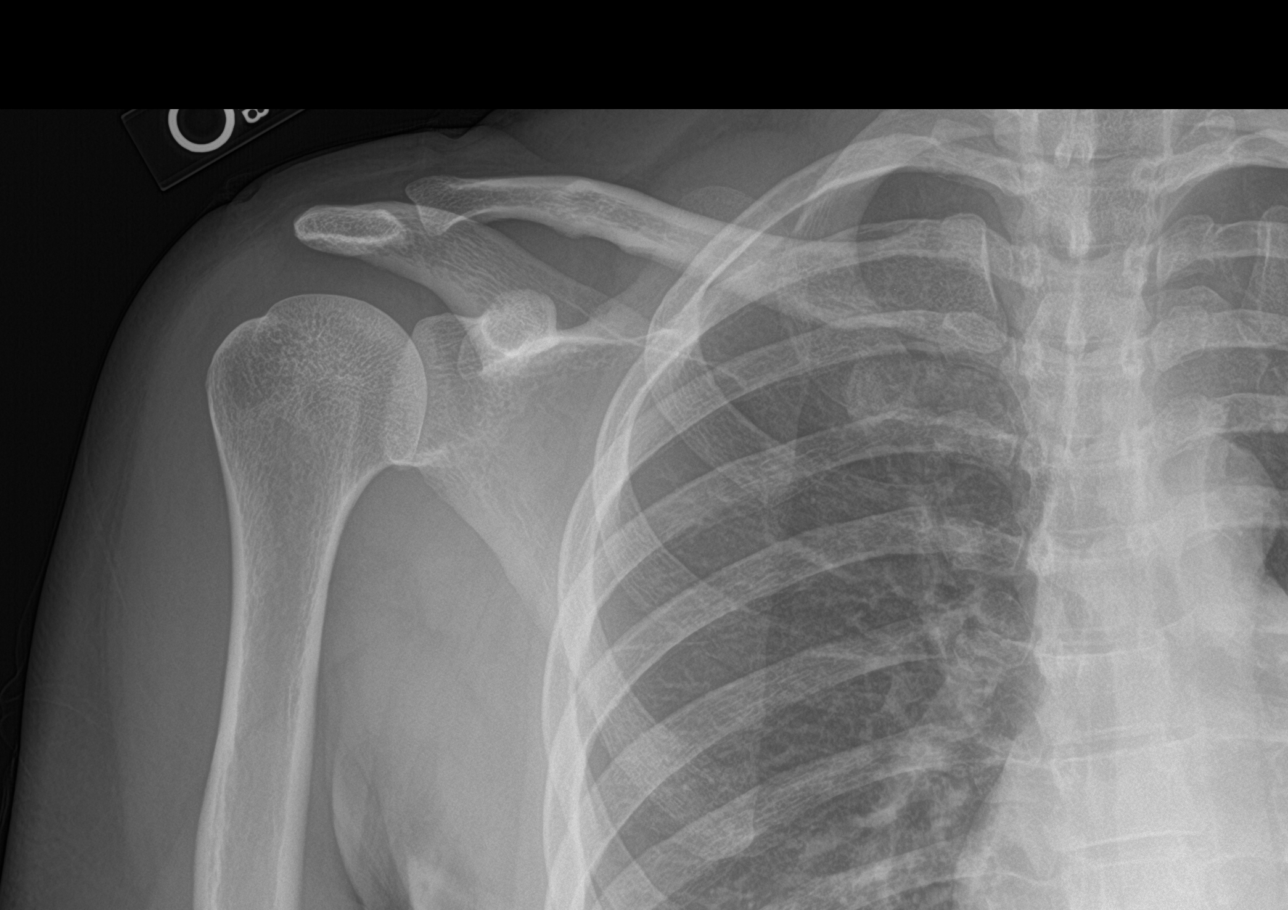

[shoulder grashey]
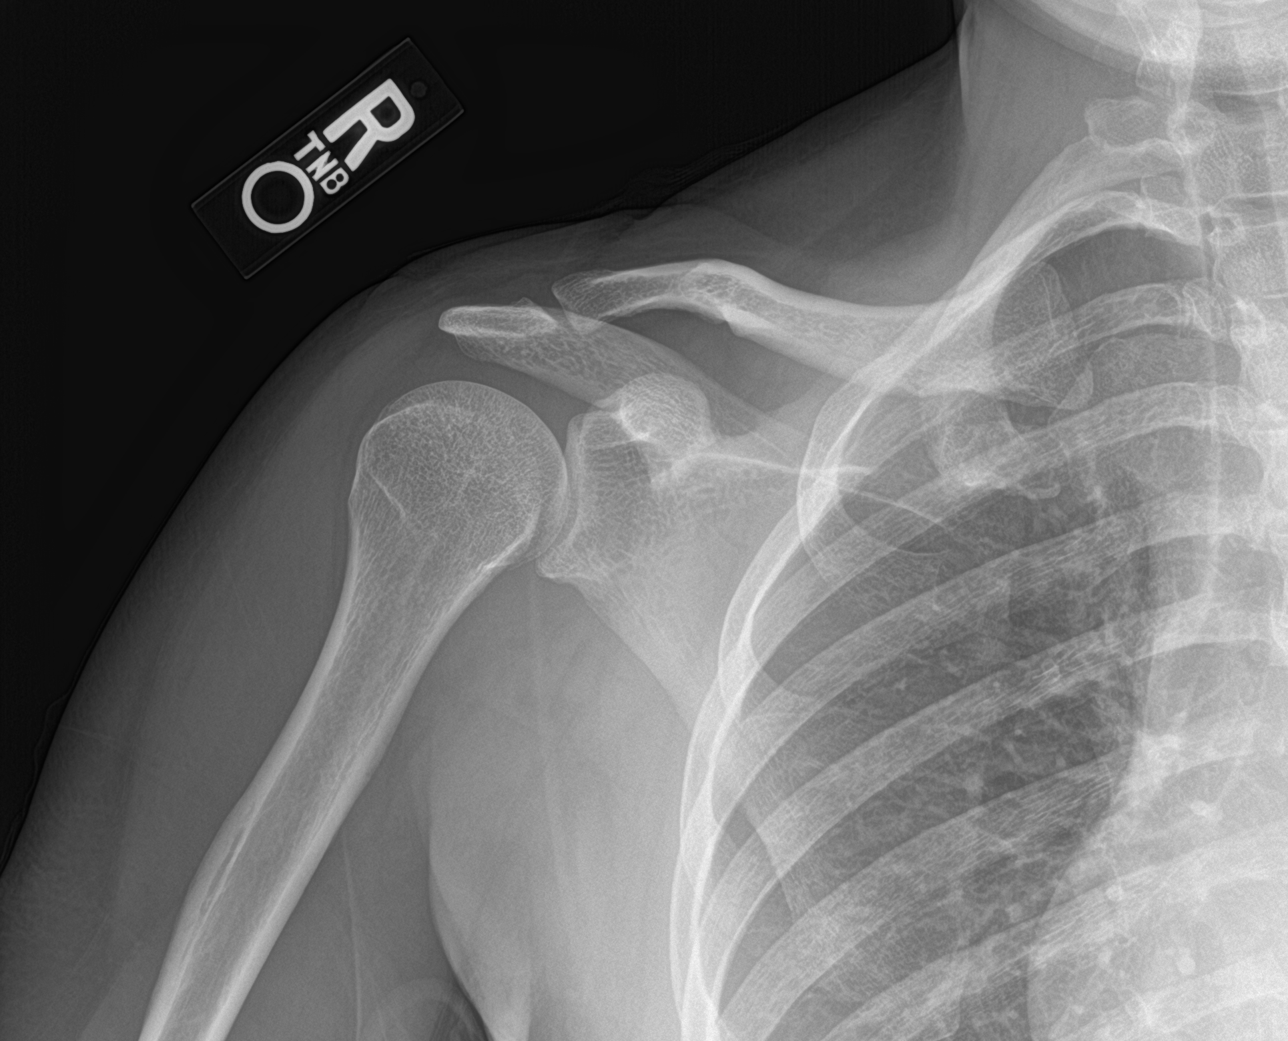

[shoulder y-view]
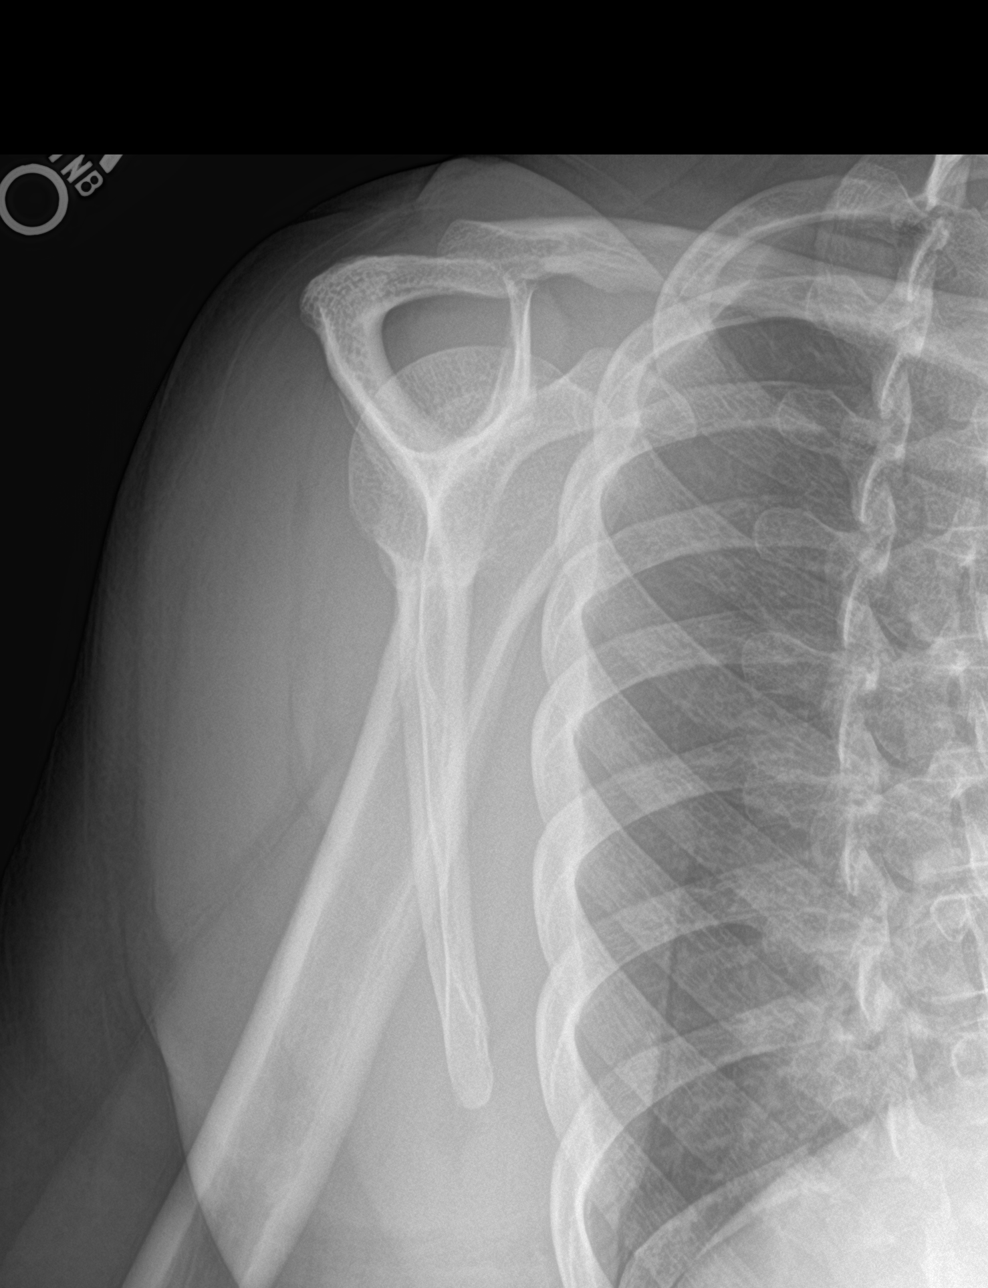

[shoulder axial]
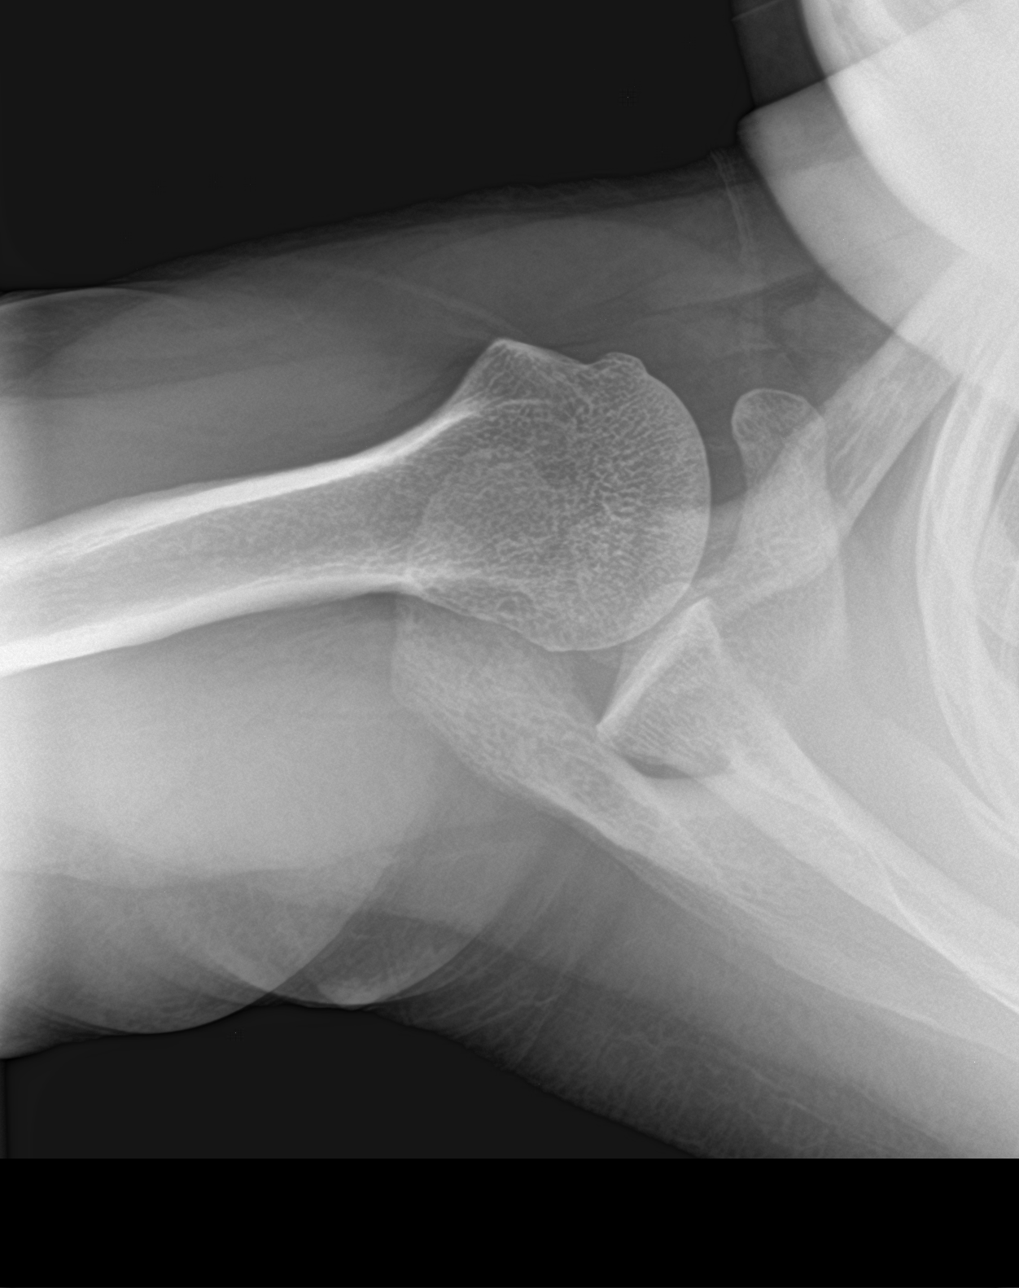

[4 of 4 positions shown; findings below may reference images not displayed]

FINDINGS: There is no evidence of fracture or dislocation. There is no
evidence of arthropathy or other focal bone abnormality. Soft
tissues are unremarkable.
IMPRESSION: Negative.

## 2020-11-16 ENCOUNTER — Encounter (HOSPITAL_COMMUNITY): Payer: Self-pay

## 2020-11-16 ENCOUNTER — Other Ambulatory Visit: Payer: Self-pay

## 2020-11-16 ENCOUNTER — Emergency Department (HOSPITAL_COMMUNITY)
Admission: EM | Admit: 2020-11-16 | Discharge: 2020-11-16 | Disposition: A | Discharge status: Home / Self Care | Payer: Medicaid Other | Attending: Emergency Medicine | Admitting: Emergency Medicine

## 2020-11-16 DIAGNOSIS — R197 Diarrhea, unspecified: Secondary | ICD-10-CM | POA: Insufficient documentation

## 2020-11-16 DIAGNOSIS — R112 Nausea with vomiting, unspecified: Secondary | ICD-10-CM

## 2020-11-16 DIAGNOSIS — R1084 Generalized abdominal pain: Secondary | ICD-10-CM | POA: Insufficient documentation

## 2020-11-16 DIAGNOSIS — E876 Hypokalemia: Secondary | ICD-10-CM | POA: Diagnosis not present

## 2020-11-16 LAB — COMPREHENSIVE METABOLIC PANEL
ALT: 13 U/L (ref 0–44)
AST: 26 U/L (ref 15–41)
Albumin: 4.7 g/dL (ref 3.5–5.0)
Alkaline Phosphatase: 44 U/L (ref 38–126)
Anion gap: 11 (ref 5–15)
BUN: 17 mg/dL (ref 6–20)
CO2: 20 mmol/L — ABNORMAL LOW (ref 22–32)
Calcium: 9.9 mg/dL (ref 8.9–10.3)
Chloride: 105 mmol/L (ref 98–111)
Creatinine, Ser: 0.86 mg/dL (ref 0.44–1.00)
GFR, Estimated: >60 mL/min (ref >60)
Glucose, Bld: 137 mg/dL — ABNORMAL HIGH (ref 70–99)
Potassium: 3.1 mmol/L — ABNORMAL LOW (ref 3.5–5.1)
Sodium: 136 mmol/L (ref 135–145)
Total Bilirubin: 0.9 mg/dL (ref 0.3–1.2)
Total Protein: 8.9 g/dL — ABNORMAL HIGH (ref 6.5–8.1)

## 2020-11-16 LAB — CBC
HCT: 30.3 % — ABNORMAL LOW (ref 36.0–46.0)
Hemoglobin: 9 g/dL — ABNORMAL LOW (ref 12.0–15.0)
MCH: 21.5 pg — ABNORMAL LOW (ref 26.0–34.0)
MCHC: 29.7 g/dL — ABNORMAL LOW (ref 30.0–36.0)
MCV: 72.3 fL — ABNORMAL LOW (ref 80.0–100.0)
Platelets: 354 10*3/uL (ref 150–400)
RBC: 4.19 MIL/uL (ref 3.87–5.11)
RDW: 15.4 % (ref 11.5–15.5)
WBC: 9.5 10*3/uL (ref 4.0–10.5)
nRBC: 0 % (ref 0.0–0.2)

## 2020-11-16 LAB — I-STAT BETA HCG BLOOD, ED (MC, WL, AP ONLY): I-stat hCG, quantitative: <5 m[IU]/mL (ref <5)

## 2020-11-16 LAB — LIPASE, BLOOD: Lipase: 41 U/L (ref 11–51)

## 2020-11-16 MED ORDER — POTASSIUM CHLORIDE ER 10 MEQ PO TBCR: 40.0000 meq | EXTENDED_RELEASE_TABLET | Freq: Every day | ORAL | 0 refills | Status: DC

## 2020-11-16 MED ORDER — ONDANSETRON 4 MG PO TBDP: 4.0000 mg | ORAL_TABLET | Freq: Three times a day (TID) | ORAL | 0 refills | Status: DC | PRN

## 2020-11-16 MED ORDER — SODIUM CHLORIDE 0.9 % IV BOLUS
1000.0000 mL | Freq: Once | INTRAVENOUS | Status: AC
  Administered 2020-11-16: 1000 mL via INTRAVENOUS

## 2020-11-16 MED ORDER — MORPHINE SULFATE (PF) 4 MG/ML IV SOLN
4.0000 mg | Freq: Once | INTRAVENOUS | Status: AC
  Administered 2020-11-16: 4 mg via INTRAVENOUS
  Filled 2020-11-16: qty 1

## 2020-11-16 MED ORDER — DICYCLOMINE HCL 10 MG/ML IM SOLN
20.0000 mg | Freq: Once | INTRAMUSCULAR | Status: AC
  Administered 2020-11-16: 20 mg via INTRAMUSCULAR
  Filled 2020-11-16: qty 2

## 2020-11-16 MED ORDER — ONDANSETRON HCL 4 MG/2ML IJ SOLN
4.0000 mg | Freq: Once | INTRAMUSCULAR | Status: AC
  Administered 2020-11-16: 4 mg via INTRAVENOUS
  Filled 2020-11-16: qty 2

## 2020-11-16 NOTE — ED Notes (Signed)
Pt ambulated to bathroom with cna assistance

## 2020-11-16 NOTE — ED Provider Notes (Signed)
[facility]-EMERGENCY DEPT Provider Note   CSN: [id number] Arrival date & time: 11/16/20  1043     History Chief Complaint  Patient presents with  . Abdominal Pain  . Emesis  . Diarrhea    [name] is a 40 y.o. female who presents to ED with a chief complaint of abdominal pain, diarrhea and emesis.  States that symptoms began about 3 days ago.  Several episodes of nonbloody, nonbilious emesis as well as diarrhea.  Reports having lower abdominal pain since this morning.  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  No urinary complaints or vaginal complaints.  No fevers.  No prior abdominal surgeries.  No chest pain, shortness of breath, back pain.  HPI     Past Medical History:  Diagnosis Date  . BV (bacterial vaginosis)   . Chlamydia   . Pain, dental   . Tinea corporis     There are no problems to display for this patient.   Past Surgical History:  Procedure Laterality Date  . TUBAL LIGATION       OB History   No obstetric history on file.     Family History  Problem Relation Age of Onset  . Stroke Other   . Diabetes Other   . Hypertension Other     Social History   Tobacco Use  . Smoking status: Never Smoker  . Smokeless tobacco: Never Used  Vaping Use  . Vaping Use: Never used  Substance Use Topics  . Alcohol use: Not Currently    Comment: socially  . Drug use: No    Home Medications Prior to Admission medications   Medication Sig Start Date End Date Taking? Authorizing Provider  ondansetron (ZOFRAN ODT) 4 MG disintegrating tablet Take 1 tablet (4 mg total) by mouth every 8 (eight) hours as needed for nausea or vomiting. 11/16/20  Yes Dorrene Bently, PA-C  potassium chloride (KLOR-CON) 10 MEQ tablet Take 4 tablets (40 mEq total) by mouth daily for  4 days. 11/16/20 11/20/20 Yes Wah Sabic, PA-C  azithromycin (ZITHROMAX) 250 MG tablet Take 1 tablet (250 mg total) by mouth daily. Take first 2 tablets together, then 1 every day until finished. 02/10/20   Hall-Potvin, [place], PA-C  benzonatate (TESSALON) 100 MG capsule Take 1 capsule (100 mg total) by mouth every 8 (eight) hours. 02/10/20   Hall-Potvin, [place], PA-C  cetirizine (ZYRTEC ALLERGY) 10 MG tablet Take 1 tablet (10 mg total) by mouth daily. 02/10/20   Hall-Potvin, [place], PA-C  fluticasone (FLONASE) 50 MCG/ACT nasal spray Place 1 spray into both nostrils daily. 02/10/20   Hall-Potvin, [place], PA-C  predniSONE (DELTASONE) 20 MG tablet Two daily with food 02/10/20   Hall-Potvin, [place], PA-C  promethazine (PHENERGAN) 25 MG tablet Take 1 tablet (25 mg total) by mouth every 6 (six) hours as needed for nausea or vomiting. 02/10/20 05/09/20  Hall-Potvin, [place], PA-C    Allergies    Patient has no known allergies.  Review of Systems   Review of Systems  Constitutional: Negative for appetite change, chills and fever.  HENT: Negative for ear pain, rhinorrhea, sneezing and sore throat.   Eyes: Negative for photophobia and visual disturbance.  Respiratory: Negative for cough, chest tightness, shortness of breath and wheezing.   Cardiovascular: Negative for chest pain and palpitations.  Gastrointestinal: Positive for abdominal pain, diarrhea, nausea and vomiting. Negative for blood in stool and constipation.  Genitourinary: Negative for dysuria, hematuria and urgency.  Musculoskeletal: Negative for myalgias.  Skin: Negative for rash.  Neurological: Negative for dizziness, weakness and light-headedness.    Physical Exam Updated Vital Signs BP (!) 147/77   Pulse 95   Temp 97.6 F (36.4 C) (Oral)   Resp 18   Ht 5\' 7"  (1.702 m)   Wt 84.4 kg   LMP 11/12/2020 (Approximate)   SpO2 100%   BMI 29.13 kg/m   Physical Exam Vitals and nursing note reviewed.  Constitutional:       General: She is not in acute distress.    Appearance: She is well-developed.  HENT:     Head: Normocephalic and atraumatic.     Nose: Nose normal.  Eyes:     General: No scleral icterus.       Left eye: No discharge.     Conjunctiva/sclera: Conjunctivae normal.  Cardiovascular:     Rate and Rhythm: Normal rate and regular rhythm.     Heart sounds: Normal heart sounds. No murmur heard. No friction rub. No gallop.   Pulmonary:     Effort: Pulmonary effort is normal. No respiratory distress.     Breath sounds: Normal breath sounds.  Abdominal:     General: Bowel sounds are normal. There is no distension.     Palpations: Abdomen is soft.     Tenderness: There is generalized abdominal tenderness. There is no guarding.  Musculoskeletal:        General: Normal range of motion.     Cervical back: Normal range of motion and neck supple.  Skin:    General: Skin is warm and dry.     Findings: No rash.  Neurological:     Mental Status: She is alert.     Motor: No abnormal muscle tone.     Coordination: Coordination normal.     ED Results / Procedures / Treatments   Labs (all labs ordered are listed, but only abnormal results are displayed) Labs Reviewed  COMPREHENSIVE METABOLIC PANEL - Abnormal; Notable for the following components:      Result Value   Potassium 3.1 (*)    CO2 20 (*)    Glucose, Bld 137 (*)    Total Protein 8.9 (*)    All other components within normal limits  CBC - Abnormal; Notable for the following components:   Hemoglobin 9.0 (*)    HCT 30.3 (*)    MCV 72.3 (*)    MCH 21.5 (*)    MCHC 29.7 (*)    All other components within normal limits  LIPASE, BLOOD  I-STAT BETA HCG BLOOD, ED ([facility], [facility], [facility] ONLY)    EKG None  Radiology No results found.  Procedures Procedures   Medications Ordered in ED Medications  sodium chloride 0.9 % bolus 1,000 mL (1,000 mLs Intravenous New Bag/Given 11/16/20 1216)  ondansetron (ZOFRAN) injection 4 mg (4 mg Intravenous  Given 11/16/20 1217)  morphine 4 MG/ML injection 4 mg (4 mg Intravenous Given 11/16/20 1217)  dicyclomine (BENTYL) injection 20 mg (20 mg Intramuscular Given 11/16/20 1217)    ED Course  I have reviewed the triage vital signs and the nursing notes.  Pertinent labs & imaging results that were available during my care of the patient were reviewed by me and considered in my medical decision making (see chart for details).    MDM Rules/Calculators/A&P  40 year old female presenting to the ED with a chief complaint of abdominal pain, diarrhea and emesis.  Symptoms began about 3 days ago.  Reports several episodes of nonbloody, nonbilious emesis and nonbloody diarrhea.  Lower abdominal pain since this morning.  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  No urinary, vaginal complaints, prior abdominal surgeries, fever, chest pain or shortness of breath.  On exam abdomen is generally tender without rebound or guarding.  Vital signs within normal limits.  Lab work here including CBC with a hemoglobin of 9 which is around her baseline.  No leukocytosis.  CMP with a potassium of 3.1 which was repleted orally.  Lipase is unremarkable.  hCG is negative.  Patient given IV fluids, Bentyl, Zofran and morphine here with complete resolution of her symptoms.  Repeat abdominal exams are benign. Suspect cause of her symptoms.  Doubt appendicitis, cholecystitis, pancreatitis or other surgical emergent cause of her symptoms based on her reassuring lab work, physical exam findings and improvement.  We will have her continue symptomatic treatment at home and increase hydration.  Advised to have potassium level rechecked in about 1 week.  Return precautions given.    Patient is hemodynamically stable, in NAD, and able to ambulate in the ED. Evaluation does not show pathology that would require ongoing emergent intervention or  inpatient treatment. I explained the diagnosis to the patient. Pain has been managed and has no complaints prior to discharge. Patient is comfortable with above plan and is stable for discharge at this time. All questions were answered prior to disposition. Strict return precautions for returning to the ED were discussed. Encouraged follow up with PCP.   An After Visit Summary was printed and given to the patient.   Portions of this note were generated with Scientist, clinical (histocompatibility and immunogenetics). Dictation errors may occur despite best attempts at proofreading.  Final Clinical Impression(s) / ED Diagnoses Final diagnoses:  Nausea vomiting and diarrhea  Hypokalemia    Rx / DC Orders ED Discharge Orders         Ordered    potassium chloride (KLOR-CON) 10 MEQ tablet  Daily        11/16/20 1326    ondansetron (ZOFRAN ODT) 4 MG disintegrating tablet  Every 8 hours PRN        11/16/20 1327           Dietrich Pates, PA-C 11/16/20 1328    [place], Madelaine Bhat, DO 11/16/20 1331

## 2020-11-16 NOTE — ED Triage Notes (Signed)
Patient c/o diarrhea x 3 days. Patient states she began having lower abdominal pain and vomiting since this AM.

## 2020-11-16 NOTE — ED Notes (Signed)
Went to triage patient and patient is in the bathroom.

## 2020-11-16 NOTE — Discharge Instructions (Signed)
Your lab work here showed a potassium level that was slightly low.  Take the potassium pills for the next few days. Have your primary care provider recheck your potassium level in the next week. Take the other medications as needed as well to up with your symptoms. Make sure you are drinking plenty of fluids and following up with your primary care provider. Return to the ER if you start to experience worsening abdominal pain, continued vomiting despite medications, fever, chest pain or shortness of breath.

## 2021-03-21 ENCOUNTER — Encounter (HOSPITAL_COMMUNITY): Payer: Self-pay

## 2021-03-21 ENCOUNTER — Other Ambulatory Visit: Payer: Self-pay

## 2021-03-21 ENCOUNTER — Emergency Department (HOSPITAL_COMMUNITY)
Admission: EM | Admit: 2021-03-21 | Discharge: 2021-03-21 | Disposition: A | Discharge status: Home / Self Care | Payer: Medicaid Other | Attending: Emergency Medicine | Admitting: Emergency Medicine

## 2021-03-21 DIAGNOSIS — D509 Iron deficiency anemia, unspecified: Secondary | ICD-10-CM | POA: Diagnosis not present

## 2021-03-21 DIAGNOSIS — K0889 Other specified disorders of teeth and supporting structures: Secondary | ICD-10-CM | POA: Diagnosis present

## 2021-03-21 DIAGNOSIS — K029 Dental caries, unspecified: Secondary | ICD-10-CM

## 2021-03-21 LAB — CBC WITH DIFFERENTIAL/PLATELET
Abs Immature Granulocytes: 0.01 10*3/uL (ref 0.00–0.07)
Basophils Absolute: 0.1 10*3/uL (ref 0.0–0.1)
Basophils Relative: 1 %
Eosinophils Absolute: 0.2 10*3/uL (ref 0.0–0.5)
Eosinophils Relative: 3 %
HCT: 26.3 % — ABNORMAL LOW (ref 36.0–46.0)
Hemoglobin: 7.4 g/dL — ABNORMAL LOW (ref 12.0–15.0)
Immature Granulocytes: 0 %
Lymphocytes Relative: 33 %
Lymphs Abs: 1.4 10*3/uL (ref 0.7–4.0)
MCH: 20.1 pg — ABNORMAL LOW (ref 26.0–34.0)
MCHC: 28.1 g/dL — ABNORMAL LOW (ref 30.0–36.0)
MCV: 71.3 fL — ABNORMAL LOW (ref 80.0–100.0)
Monocytes Absolute: 0.4 10*3/uL (ref 0.1–1.0)
Monocytes Relative: 9 %
Neutro Abs: 2.3 10*3/uL (ref 1.7–7.7)
Neutrophils Relative %: 54 %
Platelets: 346 10*3/uL (ref 150–400)
RBC: 3.69 MIL/uL — ABNORMAL LOW (ref 3.87–5.11)
RDW: 17.2 % — ABNORMAL HIGH (ref 11.5–15.5)
WBC: 4.4 10*3/uL (ref 4.0–10.5)
nRBC: 0 % (ref 0.0–0.2)

## 2021-03-21 LAB — COMPREHENSIVE METABOLIC PANEL
ALT: 12 U/L (ref 0–44)
AST: 21 U/L (ref 15–41)
Albumin: 4.4 g/dL (ref 3.5–5.0)
Alkaline Phosphatase: 40 U/L (ref 38–126)
Anion gap: 8 (ref 5–15)
BUN: 17 mg/dL (ref 6–20)
CO2: 24 mmol/L (ref 22–32)
Calcium: 9.6 mg/dL (ref 8.9–10.3)
Chloride: 108 mmol/L (ref 98–111)
Creatinine, Ser: 0.89 mg/dL (ref 0.44–1.00)
GFR, Estimated: >60 mL/min (ref >60)
Glucose, Bld: 93 mg/dL (ref 70–99)
Potassium: 3.7 mmol/L (ref 3.5–5.1)
Sodium: 140 mmol/L (ref 135–145)
Total Bilirubin: 0.6 mg/dL (ref 0.3–1.2)
Total Protein: 8.3 g/dL — ABNORMAL HIGH (ref 6.5–8.1)

## 2021-03-21 LAB — SALICYLATE LEVEL: Salicylate Lvl: <7 mg/dL — ABNORMAL LOW (ref 7.0–30.0)

## 2021-03-21 LAB — ACETAMINOPHEN LEVEL: Acetaminophen (Tylenol), Serum: <10 ug/mL — ABNORMAL LOW (ref 10–30)

## 2021-03-21 MED ORDER — PENICILLIN V POTASSIUM 500 MG PO TABS
500.0000 mg | ORAL_TABLET | Freq: Once | ORAL | Status: AC
  Administered 2021-03-21: 500 mg via ORAL
  Filled 2021-03-21: qty 1

## 2021-03-21 MED ORDER — PENICILLIN V POTASSIUM 500 MG PO TABS: 500.0000 mg | ORAL_TABLET | Freq: Four times a day (QID) | ORAL | 0 refills | Status: AC

## 2021-03-21 NOTE — ED Provider Notes (Signed)
[facility]-EMERGENCY DEPT Provider Note   CSN: [id number] Arrival date & time: 03/21/21  1208     History Chief Complaint  Patient presents with   Dental Pain    [name] is a 40 y.o. female history of tubal ligation otherwise healthy no daily medication use.  Patient presents to the ER today for 2 concerns.  Primary concern is dental pain, left lower dental pain onset 2 weeks ago pain constant moderate intensity aching nonradiating worsened with chewing on the left side improved somewhat with Tylenol ibuprofen and rest.  Not associated with any facial swelling.  Patient's second concern today is for accidental Tylenol overdose.  Patient reports that she has been taking Tylenol pretty consistently over the last 2 weeks and has gone through an entire bottle of some 24 pills unknown strength in the last 14 days.  Patient is concerned that this may have been too much Tylenol because she does not know the recommended daily limit.  Patient denies any intentional overdose and reports that she was just taking it for her dental pain.  Patient would like her blood work to be checked.  Patient denies fever/chills, eye pain, drooling, voice change, sore throat, facial swelling, neck swelling, abdominal pain, nausea/vomiting, diarrhea, intentional overdose, suicidal ideations, homicidal ideations, hallucinations or any additional concerns.  HPI     Past Medical History:  Diagnosis Date   BV (bacterial vaginosis)    Chlamydia    Pain, dental    Tinea corporis     There are no problems to display for this patient.   Past Surgical History:  Procedure Laterality Date   TUBAL LIGATION       OB History   No obstetric history on file.     Family History  Problem Relation Age of Onset   Stroke Other    Diabetes Other    Hypertension Other     Social History   Tobacco Use   Smoking status: Never   Smokeless tobacco: Never  Vaping Use   Vaping Use:  Never used  Substance Use Topics   Alcohol use: Not Currently    Comment: socially   Drug use: No    Home Medications Prior to Admission medications   Medication Sig Start Date End Date Taking? Authorizing Provider  penicillin v potassium (VEETID) 500 MG tablet Take 1 tablet (500 mg total) by mouth 4 (four) times daily for 7 days. 03/21/21 03/28/21 Yes Harlene SaltsMorelli, Audrey Thull A, PA-C  azithromycin (ZITHROMAX) 250 MG tablet Take 1 tablet (250 mg total) by mouth daily. Take first 2 tablets together, then 1 every day until finished. 02/10/20   Hall-Potvin, [place], PA-C  benzonatate (TESSALON) 100 MG capsule Take 1 capsule (100 mg total) by mouth every 8 (eight) hours. 02/10/20   Hall-Potvin, [place], PA-C  cetirizine (ZYRTEC ALLERGY) 10 MG tablet Take 1 tablet (10 mg total) by mouth daily. 02/10/20   Hall-Potvin, [place], PA-C  fluticasone (FLONASE) 50 MCG/ACT nasal spray Place 1 spray into both nostrils daily. 02/10/20   Hall-Potvin, [place], PA-C  ondansetron (ZOFRAN ODT) 4 MG disintegrating tablet Take 1 tablet (4 mg total) by mouth every 8 (eight) hours as needed for nausea or vomiting. 11/16/20   Khatri, Hina, PA-C  potassium chloride (KLOR-CON) 10 MEQ tablet Take 4 tablets (40 mEq total) by mouth daily for 4 days. 11/16/20 11/20/20  Dietrich PatesKhatri, Hina, PA-C  predniSONE (DELTASONE) 20 MG tablet Two daily with food 02/10/20   Hall-Potvin, [place], PA-C  promethazine (PHENERGAN)  25 MG tablet Take 1 tablet (25 mg total) by mouth every 6 (six) hours as needed for nausea or vomiting. 02/10/20 05/09/20  Hall-Potvin, [place], PA-C    Allergies    Patient has no known allergies.  Review of Systems   Review of Systems Ten systems are reviewed and are negative for acute change except as noted in the HPI  Physical Exam Updated Vital Signs BP (!) 146/88   Pulse 68   Temp 98.8 F (37.1 C)   Resp 17   Ht 5\' 7"  (1.702 m)   Wt 84 kg   SpO2 100%   BMI 29.00 kg/m   Physical Exam Constitutional:      General: She  is not in acute distress.    Appearance: Normal appearance. She is well-developed. She is not ill-appearing or diaphoretic.  HENT:     Head: Normocephalic and atraumatic.     Comments: Poor dentition overall, multiple dental caries and missing teeth.  Pain to percussion left lower second bicuspid versus first molar.  The patient has normal phonation and is in control of secretions. No stridor.  Midline uvula without edema. Soft palate rises symmetrically.  No tonsillar erythema, swelling or exudates. Tongue protrusion is normal, floor of mouth is soft. No trismus. No creptius on neck palpation. No gingival erythema or fluctuance noted. Mucus membranes moist. No pallor noted. Eyes:     General: Vision grossly intact. Gaze aligned appropriately.     Pupils: Pupils are equal, round, and reactive to light.  Neck:     Trachea: Trachea and phonation normal.  Pulmonary:     Effort: Pulmonary effort is normal. No respiratory distress.  Abdominal:     General: There is no distension.     Palpations: Abdomen is soft.     Tenderness: There is no abdominal tenderness. There is no guarding or rebound.  Musculoskeletal:        General: Normal range of motion.     Cervical back: Normal range of motion.  Skin:    General: Skin is warm and dry.  Neurological:     Mental Status: She is alert.     GCS: GCS eye subscore is 4. GCS verbal subscore is 5. GCS motor subscore is 6.     Comments: Speech is clear and goal oriented, follows commands Major Cranial nerves without deficit, no facial droop Moves extremities without ataxia, coordination intact  Psychiatric:        Mood and Affect: Mood normal.        Behavior: Behavior normal.        Thought Content: Thought content does not include homicidal or suicidal ideation.    ED Results / Procedures / Treatments   Labs (all labs ordered are listed, but only abnormal results are displayed) Labs Reviewed  CBC WITH DIFFERENTIAL/PLATELET - Abnormal; Notable  for the following components:      Result Value   RBC 3.69 (*)    Hemoglobin 7.4 (*)    HCT 26.3 (*)    MCV 71.3 (*)    MCH 20.1 (*)    MCHC 28.1 (*)    RDW 17.2 (*)    All other components within normal limits  COMPREHENSIVE METABOLIC PANEL - Abnormal; Notable for the following components:   Total Protein 8.3 (*)    All other components within normal limits  ACETAMINOPHEN LEVEL - Abnormal; Notable for the following components:   Acetaminophen (Tylenol), Serum <10 (*)    All other components within normal  limits  SALICYLATE LEVEL - Abnormal; Notable for the following components:   Salicylate Lvl <7.0 (*)    All other components within normal limits    EKG None  Radiology No results found.  Procedures Procedures   Medications Ordered in ED Medications  penicillin v potassium (VEETID) tablet 500 mg (500 mg Oral Given 03/21/21 1321)    ED Course  I have reviewed the triage vital signs and the nursing notes.  Pertinent labs & imaging results that were available during my care of the patient were reviewed by me and considered in my medical decision making (see chart for details).    MDM Rules/Calculators/A&P                          Additional history obtained from: Nursing notes from this visit. Review of electronic medical records. ------------------- 40 year old female presented for dental pain today.  She has multiple dental caries and missing teeth.  Some tenderness along the left lower teeth on percussion, dental caries present without signs or symptoms of dental abscess, no swelling/erythema/tenderness of the gums.  Patient is well-appearing, afebrile, nontoxic, speaking well.  Patient able to swallow without pain.  No signs of swelling or concern for Ludwig's angina/Peritonsilar abscess/Retropharyngeal abscess or other deep tissue infections.  No sign of swelling of the neck, patient has good range of motion of the neck, no trismus.  Plan of treatment is penicillin VK  500 mg 4 times daily x7 days.  Patient reports that she has a dentist appointment in 2 days, encouraged her to maintain that appointment for definitive dental care.  Additionally patient was concerned for possible overdose of Tylenol she has taken around 24 pills of unknown dosage Tylenol in the past 2 weeks.  Patient believes these may have been 500 mg but is not 100% sure, she reports that she has been alternating this with ibuprofen.  Basic labs were ordered including CBC, CMP, acetaminophen level and salicylate level.  Patient denied any SI/HI or intentional overdoses.  I ordered, reviewed and interpreted labs which include: CMP showed no emergent electrolyte derangement, AKI, LFT elevations or gap. Tylenol level is negative. Salicylate level is negative. CMP showed hemoglobin of 7.4, no leukocytosis or anemia.  There does not appear to be any evidence of acute Tylenol overdose, LFTs were reassuring.  Patient was educated on dosing of Tylenol and ibuprofen and she stated understanding.  Incidentally patient noted to have microcytic anemia, she reports she is recently finished her menstrual cycle which I feel to likely be contributing.  She is asymptomatic regarding her anemia at this time I encouraged patient to begin taking an iron supplement and to have her blood work rechecked with her PCP at her follow-up visit.  Patient was informed of signs/symptoms of anemia and to return immediately to the ER if they occur.  Patient's vital signs are stable at discharge.  At this time there does not appear to be any evidence of an acute emergency medical condition and the patient appears stable for discharge with appropriate outpatient follow up. Diagnosis was discussed with patient who verbalizes understanding of care plan and is agreeable to discharge. I have discussed return precautions with patient who verbalizes understanding. Patient encouraged to follow-up with their PCP and dentist. All questions  answered.  Patient's case discussed with Dr. Jeraldine Loots who agrees with plan to discharge with follow-up.   Note: Portions of this report may have been transcribed using voice recognition software.  Every effort was made to ensure accuracy; however, inadvertent computerized transcription errors may still be present.  Final Clinical Impression(s) / ED Diagnoses Final diagnoses:  Pain due to dental caries  Microcytic anemia    Rx / DC Orders ED Discharge Orders          Ordered    penicillin v potassium (VEETID) 500 MG tablet  4 times daily        03/21/21 [address] 03/21/21 1425    Gerhard Munch, MD 03/22/21 [phone]

## 2021-03-21 NOTE — Discharge Instructions (Addendum)
At this time there does not appear to be the presence of an emergent medical condition, however there is always the potential for conditions to change. Please read and follow the below instructions.  Please return to the Emergency Department immediately for any new or worsening symptoms. Please be sure to follow up with your Primary Care Provider within one week regarding your visit today; please call their office to schedule an appointment even if you are feeling better for a follow-up visit. Please take your antibiotic Penicillin as prescribed until complete to help with your symptoms.  Please drink enough water to avoid dehydration and get plenty of rest. Your blood work today showed a low hemoglobin level.  Please have this rechecked by your primary care provider to ensure improvement.  You may begin taking an iron supplement over-the-counter to help with your anemia. Please get your dental appointment on Tuesday for definitive dental care.  Go to the nearest Emergency Department immediately if: You have fever or chills You cannot open your mouth. You are having trouble breathing or swallowing. You have a fever. Your face, neck, or jaw is swollen You are very weak. You are short of breath. You have pain in your abdomen or chest. You are dizzy or feel faint. You have trouble concentrating. You have bloody stools, black stools, or tarry stools. You vomit repeatedly or you vomit up blood. You have any new/concerning or worsening of symptoms    Please read the additional information packets attached to your discharge summary.  Do not take your medicine if  develop an itchy rash, swelling in your mouth or lips, or difficulty breathing; call 911 and seek immediate emergency medical attention if this occurs.  You may review your lab tests and imaging results in their entirety on your MyChart account.  Please discuss all results of fully with your primary care provider and other specialist  at your follow-up visit.  Note: Portions of this text may have been transcribed using voice recognition software. Every effort was made to ensure accuracy; however, inadvertent computerized transcription errors may still be present.

## 2021-03-21 NOTE — ED Triage Notes (Signed)
Patient reports dental pain to left side x2 weeks.  Denies fever/facial swelling. Tylenol and ibuprofen with relief

## 2022-08-18 ENCOUNTER — Ambulatory Visit (HOSPITAL_COMMUNITY)
Admission: EM | Admit: 2022-08-18 | Discharge: 2022-08-18 | Disposition: A | Discharge status: Home / Self Care | Payer: No Payment, Other | Attending: Behavioral Health | Admitting: Behavioral Health

## 2022-08-18 DIAGNOSIS — Z79899 Other long term (current) drug therapy: Secondary | ICD-10-CM | POA: Insufficient documentation

## 2022-08-18 DIAGNOSIS — F32A Depression, unspecified: Secondary | ICD-10-CM | POA: Insufficient documentation

## 2022-08-18 DIAGNOSIS — F4321 Adjustment disorder with depressed mood: Secondary | ICD-10-CM

## 2022-08-18 DIAGNOSIS — R45851 Suicidal ideations: Secondary | ICD-10-CM | POA: Insufficient documentation

## 2022-08-18 DIAGNOSIS — R5383 Other fatigue: Secondary | ICD-10-CM | POA: Insufficient documentation

## 2022-08-18 MED ORDER — TRAZODONE HCL 50 MG PO TABS: 50.0000 mg | ORAL_TABLET | Freq: Every day | ORAL | 0 refills | Status: DC
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
## 2022-08-18 NOTE — ED Triage Notes (Signed)
Pt presents to [facility] voluntarily escorted by [employer] due to passive SI. Pt states she was at work today and told her boss she was experiencing SI since the passing of her son in October 2022 and her boss called the police to escort her to this facility. Pt denies any plan or intent to harm herself. Pt verbally contracts for safety. Pt denies HI and AVH.

## 2022-08-18 NOTE — Discharge Instructions (Addendum)
Discharge recommendations:   Medications: Patient is to take medications as prescribed. The patient or patient's guardian is to contact a medical professional and/or outpatient provider to address any new side effects that develop. The patient or the patient's guardian should update outpatient providers of any new medications and/or medication changes.   Outpatient Follow up: Please review list of outpatient resources for psychiatry and counseling. Please follow up with your primary care provider for all medical related needs.   Therapy: We recommend that patient participate in individual therapy to address mental health concerns.  Safety:   The following safety precautions should be taken:   No sharp objects. This includes scissors, razors, scrapers, and putty knives.   Chemicals should be removed and locked up.   Medications should be removed and locked up.   Weapons should be removed and locked up. This includes firearms, knives and instruments that can be used to cause injury.   The patient should abstain from use of illicit substances/drugs and abuse of any medications.  If symptoms worsen or do not continue to improve or if the patient becomes actively suicidal or homicidal then it is recommended that the patient return to the closest hospital emergency department, the [facility], or call 911 for further evaluation and treatment. National Suicide Prevention Lifeline 1-800-SUICIDE or 1-800-273-8255.  About 988 988 offers 24/7 access to trained crisis counselors who can help people experiencing mental health-related distress. People can call or text 988 or chat 988lifeline.org for themselves or if they are worried about a loved one who may need crisis support.      

## 2022-09-12 ENCOUNTER — Encounter (HOSPITAL_COMMUNITY): Payer: Self-pay

## 2022-09-12 ENCOUNTER — Ambulatory Visit (HOSPITAL_COMMUNITY): Payer: No Payment, Other | Admitting: Mental Health

## 2022-09-12 ENCOUNTER — Telehealth (HOSPITAL_COMMUNITY): Payer: Self-pay | Admitting: Mental Health

## 2022-09-12 NOTE — Telephone Encounter (Signed)
Therapist sent link over for tele-therapy session. Straight to VM. Left HIPAA compliant message to return call

## 2022-09-13 ENCOUNTER — Ambulatory Visit (HOSPITAL_COMMUNITY): Payer: No Payment, Other | Admitting: Physician Assistant

## 2023-11-02 ENCOUNTER — Emergency Department (HOSPITAL_COMMUNITY): Admission: EM | Admit: 2023-11-02 | Discharge: 2023-11-02 | Disposition: A | Discharge status: Home / Self Care

## 2023-11-02 ENCOUNTER — Encounter (HOSPITAL_COMMUNITY): Payer: Self-pay

## 2023-11-02 ENCOUNTER — Other Ambulatory Visit: Payer: Self-pay

## 2023-11-02 DIAGNOSIS — R1084 Generalized abdominal pain: Secondary | ICD-10-CM | POA: Diagnosis not present

## 2023-11-02 DIAGNOSIS — E876 Hypokalemia: Secondary | ICD-10-CM | POA: Insufficient documentation

## 2023-11-02 DIAGNOSIS — R112 Nausea with vomiting, unspecified: Secondary | ICD-10-CM | POA: Diagnosis present

## 2023-11-02 DIAGNOSIS — R197 Diarrhea, unspecified: Secondary | ICD-10-CM | POA: Diagnosis not present

## 2023-11-02 LAB — CBC
HCT: 29 % — ABNORMAL LOW (ref 36.0–46.0)
Hemoglobin: 8.1 g/dL — ABNORMAL LOW (ref 12.0–15.0)
MCH: 20 pg — ABNORMAL LOW (ref 26.0–34.0)
MCHC: 27.9 g/dL — ABNORMAL LOW (ref 30.0–36.0)
MCV: 71.8 fL — ABNORMAL LOW (ref 80.0–100.0)
Platelets: 392 10*3/uL (ref 150–400)
RBC: 4.04 MIL/uL (ref 3.87–5.11)
RDW: 16.6 % — ABNORMAL HIGH (ref 11.5–15.5)
WBC: 4.2 10*3/uL (ref 4.0–10.5)
nRBC: 0 % (ref 0.0–0.2)

## 2023-11-02 LAB — COMPREHENSIVE METABOLIC PANEL
ALT: 14 U/L (ref 0–44)
AST: 27 U/L (ref 15–41)
Albumin: 4.2 g/dL (ref 3.5–5.0)
Alkaline Phosphatase: 44 U/L (ref 38–126)
Anion gap: 9 (ref 5–15)
BUN: 17 mg/dL (ref 6–20)
CO2: 22 mmol/L (ref 22–32)
Calcium: 8.9 mg/dL (ref 8.9–10.3)
Chloride: 104 mmol/L (ref 98–111)
Creatinine, Ser: 1.04 mg/dL — ABNORMAL HIGH (ref 0.44–1.00)
GFR, Estimated: >60 mL/min (ref >60)
Glucose, Bld: 107 mg/dL — ABNORMAL HIGH (ref 70–99)
Potassium: 2.7 mmol/L — CL (ref 3.5–5.1)
Sodium: 135 mmol/L (ref 135–145)
Total Bilirubin: 0.8 mg/dL (ref 0.0–1.2)
Total Protein: 8.2 g/dL — ABNORMAL HIGH (ref 6.5–8.1)

## 2023-11-02 LAB — LIPASE, BLOOD: Lipase: 38 U/L (ref 11–51)

## 2023-11-02 LAB — HCG, SERUM, QUALITATIVE: Preg, Serum: NEGATIVE

## 2023-11-02 LAB — RESP PANEL BY RT-PCR (RSV, FLU A&B, COVID)  RVPGX2
Influenza A by PCR: NEGATIVE
Influenza B by PCR: NEGATIVE
Resp Syncytial Virus by PCR: NEGATIVE
SARS Coronavirus 2 by RT PCR: NEGATIVE

## 2023-11-02 LAB — CBG MONITORING, ED: Glucose-Capillary: 107 mg/dL — ABNORMAL HIGH (ref 70–99)

## 2023-11-02 MED ORDER — ONDANSETRON 4 MG PO TBDP: 4.0000 mg | ORAL_TABLET | Freq: Three times a day (TID) | ORAL | 0 refills | Status: AC | PRN

## 2023-11-02 MED ORDER — POTASSIUM CHLORIDE CRYS ER 20 MEQ PO TBCR
40.0000 meq | EXTENDED_RELEASE_TABLET | Freq: Once | ORAL | Status: AC
  Administered 2023-11-02: 40 meq via ORAL
  Filled 2023-11-02: qty 2

## 2023-11-02 MED ORDER — PROMETHAZINE HCL 25 MG RE SUPP: 25.0000 mg | Freq: Four times a day (QID) | RECTAL | 0 refills | Status: DC | PRN

## 2023-11-02 MED ORDER — ONDANSETRON HCL 4 MG/2ML IJ SOLN
4.0000 mg | Freq: Once | INTRAMUSCULAR | Status: AC
  Administered 2023-11-02: 4 mg via INTRAVENOUS
  Filled 2023-11-02: qty 2

## 2023-11-02 MED ORDER — LACTATED RINGERS IV BOLUS
1000.0000 mL | Freq: Once | INTRAVENOUS | Status: AC
  Administered 2023-11-02: 1000 mL via INTRAVENOUS

## 2023-11-02 NOTE — ED Provider Notes (Signed)
 [place] EMERGENCY DEPARTMENT AT [facility] Provider Note   CSN: [id number] Arrival date & time: 11/02/23  4098     History  Chief Complaint  Patient presents with   Abdominal Pain   Emesis    [name] is a 43 y.o. female.  This is a 43 year old female presenting emergency department for generalized abdominal pain with nausea vomiting diarrhea.  Reports symptoms started late Monday evening and has been persistent since that time.  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  Reports not been able to keep any thing p.o. down due to nausea vomiting.  Denies chest pain, shortness of breath.  No dysuria, no vaginal discharge.  Denies prior surgeries on her abdomen.   Abdominal Pain Associated symptoms: vomiting   Emesis Associated symptoms: abdominal pain        Home Medications Prior to Admission medications   Medication Sig Start Date End Date Taking? Authorizing Provider  ondansetron (ZOFRAN-ODT) 4 MG disintegrating tablet Take 1 tablet (4 mg total) by mouth every 8 (eight) hours as needed for up to 5 days for nausea or vomiting. 11/02/23 11/07/23 Yes Coral Spikes, DO  promethazine (PHENERGAN) 25 MG suppository Place 1 suppository (25 mg total) rectally every 6 (six) hours as needed for nausea or vomiting. 11/02/23  Yes Coral Spikes, DO  azithromycin (ZITHROMAX) 250 MG tablet Take 1 tablet (250 mg total) by mouth daily. Take first 2 tablets together, then 1 every day until finished. 02/10/20   Hall-Potvin, [place], PA-C  benzonatate (TESSALON) 100 MG capsule Take 1 capsule (100 mg total) by mouth every 8 (eight) hours. 02/10/20   Hall-Potvin, [place], PA-C  cetirizine (ZYRTEC ALLERGY) 10 MG tablet Take 1 tablet (10 mg total) by mouth daily. 02/10/20   Hall-Potvin, [place], PA-C  fluticasone (FLONASE) 50 MCG/ACT nasal spray Place 1 spray into both nostrils daily. 02/10/20   Hall-Potvin, [place], PA-C  potassium  chloride (KLOR-CON) 10 MEQ tablet Take 4 tablets (40 mEq total) by mouth daily for 4 days. 11/16/20 11/20/20  Dietrich Pates, PA-C  predniSONE (DELTASONE) 20 MG tablet Two daily with food 02/10/20   Hall-Potvin, [place], PA-C  traZODone (DESYREL) 50 MG tablet Take 1 tablet (50 mg total) by mouth at bedtime. 08/18/22   White, Chrystine Oiler, NP      Allergies    Patient has no known allergies.    Review of Systems   Review of Systems  Gastrointestinal:  Positive for abdominal pain and vomiting.    Physical Exam Updated Vital Signs BP 131/81   Pulse (!) 58   Temp 98.6 F (37 C) (Oral)   Resp 18   Ht 5\' 7"  (1.702 m)   Wt 84 kg   SpO2 100%   BMI 29.00 kg/m  Physical Exam Vitals and nursing note reviewed.  Constitutional:      General: She is not in acute distress.    Appearance: She is not toxic-appearing.  HENT:     Head: Normocephalic and atraumatic.  Cardiovascular:     Rate and Rhythm: Normal rate and regular rhythm.  Pulmonary:     Effort: Pulmonary effort is normal.     Breath sounds: Normal breath sounds.  Abdominal:     General: Abdomen is flat.     Palpations: Abdomen is soft.     Tenderness: There is generalized abdominal tenderness.  Skin:    General: Skin is warm and dry.     Capillary Refill:  Capillary refill takes less than 2 seconds.  Neurological:     Mental Status: She is alert and oriented to person, place, and time.     ED Results / Procedures / Treatments   Labs (all labs ordered are listed, but only abnormal results are displayed) Labs Reviewed  CBC - Abnormal; Notable for the following components:      Result Value   Hemoglobin 8.1 (*)    HCT 29.0 (*)    MCV 71.8 (*)    MCH 20.0 (*)    MCHC 27.9 (*)    RDW 16.6 (*)    All other components within normal limits  COMPREHENSIVE METABOLIC PANEL - Abnormal; Notable for the following components:   Potassium 2.7 (*)    Glucose, Bld 107 (*)    Creatinine, Ser 1.04 (*)    Total Protein 8.2 (*)    All  other components within normal limits  CBG MONITORING, ED - Abnormal; Notable for the following components:   Glucose-Capillary 107 (*)    All other components within normal limits  RESP PANEL BY RT-PCR (RSV, FLU A&B, COVID)  RVPGX2  LIPASE, BLOOD  HCG, SERUM, QUALITATIVE  URINALYSIS, ROUTINE W REFLEX MICROSCOPIC    EKG EKG Interpretation Date/Time:  Thursday November 02 2023 11:03:51 EDT Ventricular Rate:  60 PR Interval:  167 QRS Duration:  98 QT Interval:  432 QTC Calculation: 432 R Axis:   34  Text Interpretation: Sinus rhythm Nonspecific T abnormalities, anterior leads Confirmed by Estanislado Pandy [phone]) on 11/02/2023 11:09:53 AM  Radiology No results found.  Procedures Procedures    Medications Ordered in ED Medications  lactated ringers bolus 1,000 mL (0 mLs Intravenous Stopped 11/02/23 0923)  ondansetron (ZOFRAN) injection 4 mg (4 mg Intravenous Given 11/02/23 0754)  potassium chloride SA (KLOR-CON M) CR tablet 40 mEq (40 mEq Oral Given 11/02/23 6045)    ED Course/ Medical Decision Making/ A&P Clinical Course as of 11/02/23 1140  Thu Nov 02, 2023  4098 CBC(!) No leukocytosis.  Stable anemia as compared to prior labs [TY]  0914 Potassium(!!): 2.7 Will replace [TY]  0914 Lipase: 38 Not consistent with pancreatitis [TY]  0914 Comprehensive metabolic panel(!!) Minor elevation in creatinine, not consistent with true AKI however.  Was given IV fluids.  No transaminitis to suggest hepatobiliary disease. [TY]  1127 Tolerating p.o. [TY]  1127 Resp panel by RT-PCR (RSV, Flu A&B, Covid) Anterior Nasal Swab Negative [TY]  1127 Preg, Serum: NEGATIVE Hyperemesis gravidarum unlikely. Ectopic unlike.y  [TY]  1128 Patient feeling improved after IVFs and zofran. Continues to have benign abd exam. Tolerating PO. Likely viral gastroenteritis. Discussed supportive care. Return precautions given.  [TY]    Clinical Course User Index [TY] Coral Spikes, DO                                  Medical Decision Making This is a 43 year old female presenting emergency department with generalized abdominal pain, nausea vomiting diarrhea.  She is afebrile, mildly elevated heart rate, slightly hypertensive.  On exam has soft abdomen, with some mild generalized tenderness.  Started actively vomiting/retching during my interview.  Suspect viral etiology; will get flu/COVID/RSV.  Will get screening labs to to help evaluate for possible hepatobiliary source/pancreatitis and evaluate for evidence of dehydration.  Will give 1 L IV fluids and IV Zofran for supportive care.  See ED course for further MDM/disposition.  Amount and/or Complexity of  Data Reviewed External Data Reviewed: radiology.    Details: Prior CT scan reviewed; no prior pathology noted Labs: ordered. Decision-making details documented in ED Course. Radiology:  Decision-making details documented in ED Course.    Details: Considered CT abdomen, however reassuring exam and vitals.  Labs also without significant abnormality.  Suspect viral etiology; will forego CT scan at this time ECG/medicine tests: ordered. Decision-making details documented in ED Course.  Risk Prescription drug management. Decision regarding hospitalization.          Final Clinical Impression(s) / ED Diagnoses Final diagnoses:  Nausea vomiting and diarrhea  Hypokalemia    Rx / DC Orders ED Discharge Orders          Ordered    ondansetron (ZOFRAN-ODT) 4 MG disintegrating tablet  Every 8 hours PRN        11/02/23 1130    promethazine (PHENERGAN) 25 MG suppository  Every 6 hours PRN        11/02/23 1130              Coral Spikes, DO 11/02/23 1140

## 2023-11-02 NOTE — Discharge Instructions (Addendum)
 Please follow-up with your primary doctor to recheck your potassium levels.  You may take the nausea medications that we have prescribed you.  Try to maintain adequate hydration with frequent sips of clear liquids/low sugar Gatorade.  Return immediately 12 fevers, chills, chest pain, shortness of breath, worsening abdominal pain, inability to drink due to nausea vomiting or any new or worsening symptoms that are concerning to you.

## 2023-11-02 NOTE — ED Triage Notes (Signed)
 Patient is here for evaluation of abdominal pain with nausea, vomiting and diarrhea since Monday. Reports unable to keep anything down.

## 2024-05-01 ENCOUNTER — Ambulatory Visit
Admission: EM | Admit: 2024-05-01 | Discharge: 2024-05-01 | Disposition: A | Discharge status: Home / Self Care | Attending: Family Medicine | Admitting: Family Medicine

## 2024-05-01 ENCOUNTER — Other Ambulatory Visit: Payer: Self-pay

## 2024-05-01 DIAGNOSIS — J00 Acute nasopharyngitis [common cold]: Secondary | ICD-10-CM | POA: Diagnosis not present

## 2024-05-01 DIAGNOSIS — R6889 Other general symptoms and signs: Secondary | ICD-10-CM

## 2024-05-01 LAB — POC COVID19/FLU A&B COMBO
Covid Antigen, POC: NEGATIVE
Influenza A Antigen, POC: NEGATIVE
Influenza B Antigen, POC: NEGATIVE

## 2024-05-01 MED ORDER — PROMETHAZINE-DM 6.25-15 MG/5ML PO SYRP: 5.0000 mL | ORAL_SOLUTION | Freq: Four times a day (QID) | ORAL | 0 refills | Status: AC | PRN

## 2024-05-01 NOTE — ED Provider Notes (Signed)
 [facility]   [id number] 05/01/24 Arrival Time: 1412  ASSESSMENT & PLAN:  1. Not feeling great   2. Common cold     Discussed typical duration of likely viral illness. Results for orders placed or performed during the hospital encounter of 05/01/24  POC Covid19/Flu A&B Antigen   Collection Time: 05/01/24  2:52 PM  Result Value Ref Range   Influenza A Antigen, POC Negative Negative   Influenza B Antigen, POC Negative Negative   Covid Antigen, POC Negative Negative   OTC symptom care as needed.  Meds ordered this encounter  Medications   promethazine -dextromethorphan (PROMETHAZINE -DM) 6.25-15 MG/5ML syrup    Sig: Take 5 mLs by mouth 4 (four) times daily as needed for cough.    Dispense:  118 mL    Refill:  0   Work note provided.  Follow-up Information     [facility]).   Specialty: Urgent Care Why: If worsening or failing to improve as anticipated. Contact information: [address] [place]  [address] [phone]                Reviewed expectations re: course of current medical issues. Questions answered. Outlined signs and symptoms indicating need for more acute intervention. Understanding verbalized. After Visit Summary given.   SUBJECTIVE: History from: Patient. [name] is a 43 y.o. female. Pt states she has had congestion, runny nose, sneezing, hot/cold since Monday night. 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 She hasn't taken any meds today Denies: fever and difficulty breathing. Normal PO intake without n/v/d.  OBJECTIVE:  Vitals:   05/01/24 1441  BP: 121/80  Pulse: 83  Resp: 18  Temp: 99 F (37.2 C)  TempSrc: Oral  SpO2: 99%    General appearance: alert; no distress Eyes: PERRLA; EOMI; conjunctiva normal HENT: [place]; AT; with nasal congestion Neck: supple  Lungs: speaks full sentences without difficulty;  unlabored; dry cough Extremities: no edema Skin: warm and dry Neurologic: normal gait Psychological: alert and cooperative; normal mood and affect  Labs: Results for orders placed or performed during the hospital encounter of 05/01/24  POC Covid19/Flu A&B Antigen   Collection Time: 05/01/24  2:52 PM  Result Value Ref Range   Influenza A Antigen, POC Negative Negative   Influenza B Antigen, POC Negative Negative   Covid Antigen, POC Negative Negative   Labs Reviewed  POC COVID19/FLU A&B COMBO - Normal    Imaging: No results found.  No Known Allergies  Past Medical History:  Diagnosis Date   BV (bacterial vaginosis)    Chlamydia    Pain, dental    Tinea corporis    Social History   Socioeconomic History   Marital status: Single    Spouse name: Not on file   Number of children: Not on file   Years of education: Not on file   Highest education level: Not on file  Occupational History   Not on file  Tobacco Use   Smoking status: Never   Smokeless tobacco: Never  Vaping Use   Vaping status: Never Used  Substance and Sexual Activity   Alcohol use: Not Currently    Comment: socially   Drug use: No   Sexual activity: Yes    Birth control/protection: Surgical  Other Topics Concern   Not on file  Social History Narrative   Not on file   Social Drivers of Corporate investment banker  Strain: Not on file  Food Insecurity: Not on file  Transportation Needs: Not on file  Physical Activity: Not on file  Stress: Not on file  Social Connections: Unknown (12/18/2021)   Received from [facility]   Social Network    Social Network: Not on file  Intimate Partner Violence: Unknown (11/15/2021)   Received from [facility]   HITS    Physically Hurt: Not on file    Insult or Talk Down To: Not on file    Threaten Physical Harm: Not on file    Scream or Curse: Not on file   Family History  Problem Relation Age of Onset   Stroke Other    Diabetes Other     Hypertension Other    Past Surgical History:  Procedure Laterality Date   TUBAL LIGATION       Rolinda Rogue, MD 05/01/24 [phone]

## 2024-05-01 NOTE — Discharge Instructions (Signed)
 Results for orders placed or performed during the hospital encounter of 05/01/24  POC Covid19/Flu A&B Antigen   Collection Time: 05/01/24  2:52 PM  Result Value Ref Range   Influenza A Antigen, POC Negative Negative   Influenza B Antigen, POC Negative Negative   Covid Antigen, POC Negative Negative

## 2024-05-01 NOTE — ED Triage Notes (Signed)
 Pt states she has had congestion, runny nose, sneezing, hot/cold since Monday night. 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 She hasn't taken any meds today
